# Patient Record
Sex: Female | Born: 1980 | Race: White | Hispanic: No | Marital: Married | Smoking: Never smoker
Health system: Southern US, Community
[De-identification: ages and names within clinical notes are randomized; demographics above are authoritative.]

## PROBLEM LIST (undated history)

## (undated) DIAGNOSIS — E042 Nontoxic multinodular goiter: Secondary | ICD-10-CM

## (undated) DIAGNOSIS — R87629 Unspecified abnormal cytological findings in specimens from vagina: Secondary | ICD-10-CM

## (undated) HISTORY — PX: WISDOM TOOTH EXTRACTION: SHX21

## (undated) HISTORY — DX: Nontoxic multinodular goiter: E04.2

---

## 2007-09-30 ENCOUNTER — Ambulatory Visit: Payer: Self-pay | Admitting: Obstetrics & Gynecology

## 2007-10-07 ENCOUNTER — Ambulatory Visit: Payer: Self-pay | Admitting: Obstetrics & Gynecology

## 2007-10-14 ENCOUNTER — Ambulatory Visit: Payer: Self-pay | Admitting: Obstetrics & Gynecology

## 2007-10-21 ENCOUNTER — Ambulatory Visit: Payer: Self-pay | Admitting: Obstetrics & Gynecology

## 2007-10-27 ENCOUNTER — Ambulatory Visit: Payer: Self-pay | Admitting: *Deleted

## 2007-10-27 ENCOUNTER — Inpatient Hospital Stay (HOSPITAL_COMMUNITY): Admission: AD | Admit: 2007-10-27 | Discharge: 2007-10-29 | Payer: Self-pay | Admitting: Obstetrics & Gynecology

## 2010-09-18 NOTE — Op Note (Signed)
NAME:  Monda, Malone                    ACCOUNT NO.:  [id number]   MEDICAL RECORD NO.:  [id number]          PATIENT TYPE:  INP   LOCATION:  9107                          FACILITY:  [facility]   PHYSICIAN:  Ginger Carne, MD  DATE OF BIRTH:  1981/04/16   DATE OF PROCEDURE:  DATE OF DISCHARGE:                               OPERATIVE REPORT   PREOPERATIVE DIAGNOSES:  1. Intrauterine pregnancy at 40 weeks 1 day gestation.  2. Prolonged second stage of labor.  3. Chorioamnionitis.  4. Maternal exhaustion.   POSTOPERATIVE DIAGNOSES:  1. Intrauterine pregnancy at 40 weeks 1 day gestation.  2. Prolonged second stage of labor.  3. Chorioamnionitis.  4. Maternal exhaustion.   PROCEDURE:  Vacuum-assisted vaginal delivery.   SURGEON:  Karlton Lemon, MD.   ASSISTANT:  Blima Rich, MD.   FINDINGS:  Viable infant female weighing 8 pounds 3 ounces with Apgars 8  at one minute and 9 at five minutes.   ESTIMATED BLOOD LOSS:  400 mL.   COMPLICATIONS:  Second-degree perineal laceration.   INDICATIONS FOR PROCEDURE:  Ms. Lopata is a 30 year old gravida 1, para 0  with presentation to labor and delivery after spontaneous rupture of  membranes at 1 a.m. on the morning of October 27, 2007.  She was in early  labor, but did require augmentation of her labor with Pitocin.  She  progressed to complete at around 4:30 p.m. on October 27, 2007.  She was +1  station that time.  After 2 hours of pushing, she progressed to +2  station and was starting to get tired.  Due to prolonged second stage of  labor and maternal exhaustion as well as chorioamnionitis vacuum-  assisted vaginal delivery, the low pelvic outlet was indicated.   DESCRIPTION OF PROCEDURE:  The patient was inspected digitally and the  infant was found to be in the right occipitoposterior position.  Vacuum  was placed being careful not to place over the fontanelles.  With  contractions, the vacuum was pumped up to the green zone on the pressure  gauge meter on the vacuum.  As the patient pushed, traction was applied  to the vacuum beginning in a downward pressure.  On her second push,  there was a one pop off.  There were two more pop offs prior to delivery  of the baby.  After applying the vacuum for the fourth time, the patient  pushed and vacuum traction was again applied with the pressure gauge in  the green area.  The vacuum was pulled to provide good traction with a  normal choreic movements of the infant.  With a fourth pull, the  infant's head was delivered.  The anterior shoulder was then delivered  followed by body.  The infant was bulb suctioned after delivery.  Cord  was doubly clamped and incised.  The infant was taken to the warmer.  The infant did cry spontaneously after delivery.  Attention was then  turned to the placenta and cord blood was to be collected by the cord  bank.  Placenta delivered spontaneously intact  with three-vessel cord at  1946.  A second-degree perineal laceration was noted and was repaired  with 2-0 Vicryl in a normal fashion.  Estimated blood loss was noted to  be 400 mL.  The laceration was inspected once more and found to have  good hemostasis.  The patient and infant tolerated the procedure well  and will go to mother and baby in stable condition.      Karlton Lemon, MD  Electronically Signed     ______________________________  Ginger Carne, MD    NS/MEDQ  D:  10/27/2007  T:  10/28/2007  Job:  [id number]

## 2011-01-30 LAB — POCT URINALYSIS DIP (DEVICE)
Bilirubin Urine: NEGATIVE
Glucose, UA: NEGATIVE
Nitrite: NEGATIVE
Operator id: 297281
Specific Gravity, Urine: 1.015
Urobilinogen, UA: 0.2

## 2011-01-31 LAB — POCT URINALYSIS DIP (DEVICE)
Bilirubin Urine: NEGATIVE
Glucose, UA: NEGATIVE
Glucose, UA: NEGATIVE
Glucose, UA: NEGATIVE
Nitrite: NEGATIVE
Nitrite: NEGATIVE
Nitrite: NEGATIVE
Operator id: 297281
Operator id: 297281
Operator id: 297281
Specific Gravity, Urine: 1.02
Urobilinogen, UA: 0.2
Urobilinogen, UA: 0.2
Urobilinogen, UA: 0.2

## 2011-01-31 LAB — CBC
HCT: 32.1 — ABNORMAL LOW
Hemoglobin: 10.9 — ABNORMAL LOW
MCHC: 34.3
Platelets: 177
RBC: 4.36
WBC: 11.2 — ABNORMAL HIGH
WBC: 18.7 — ABNORMAL HIGH

## 2011-01-31 LAB — RPR: RPR Ser Ql: NONREACTIVE

## 2011-01-31 LAB — RH IMMUNE GLOB WKUP(>/=20WKS)(NOT WOMEN'S HOSP)

## 2013-01-01 ENCOUNTER — Other Ambulatory Visit: Payer: Self-pay | Admitting: Nurse Practitioner

## 2013-01-01 DIAGNOSIS — Z3201 Encounter for pregnancy test, result positive: Secondary | ICD-10-CM

## 2013-02-09 LAB — OB RESULTS CONSOLE ANTIBODY SCREEN: Antibody Screen: NEGATIVE

## 2013-02-09 LAB — OB RESULTS CONSOLE ABO/RH: RH TYPE: NEGATIVE

## 2013-02-09 LAB — OB RESULTS CONSOLE HEPATITIS B SURFACE ANTIGEN: HEP B S AG: NEGATIVE

## 2013-02-09 LAB — OB RESULTS CONSOLE HIV ANTIBODY (ROUTINE TESTING): HIV: NONREACTIVE

## 2013-02-09 LAB — OB RESULTS CONSOLE RUBELLA ANTIBODY, IGM: RUBELLA: NON-IMMUNE/NOT IMMUNE

## 2013-02-16 LAB — OB RESULTS CONSOLE GC/CHLAMYDIA
Chlamydia: NEGATIVE
Gonorrhea: NEGATIVE

## 2013-02-26 ENCOUNTER — Other Ambulatory Visit: Payer: Self-pay | Admitting: Nurse Practitioner

## 2013-05-06 NOTE — L&D Delivery Note (Signed)
Delivery Note At 7:50 AM a viable female was delivered via Vaginal, Spontaneous Delivery (Presentation: Right Occiput Anterior).  APGAR: 9, 9; weight .   Placenta status: Intact, Spontaneous.  Cord: 3 vessels with the following complications: None.  Cord pH: not indicated  Anesthesia: Epidural  Episiotomy: None Lacerations: 2nd degree;Perineal Suture Repair: 3.0 vicryl rapide 3 interrupted sutures placed, good tissue approximation Est. Blood Loss (mL):   Mom to postpartum.  Baby to Couplet care / Skin to Skin.  Jack Mineau A. Dashanique Brownstein 08/23/2013, 8:06 AM

## 2013-06-11 LAB — OB RESULTS CONSOLE RPR: RPR: NONREACTIVE

## 2013-08-02 LAB — OB RESULTS CONSOLE GBS: GBS: NEGATIVE

## 2013-08-23 ENCOUNTER — Inpatient Hospital Stay (HOSPITAL_COMMUNITY)
Admission: AD | Admit: 2013-08-23 | Discharge: 2013-08-25 | DRG: 775 | Disposition: A | Discharge status: Home / Self Care | Payer: 59 | Source: Ambulatory Visit | Attending: Obstetrics | Admitting: Obstetrics

## 2013-08-23 ENCOUNTER — Inpatient Hospital Stay (HOSPITAL_COMMUNITY): Payer: 59 | Admitting: Anesthesiology

## 2013-08-23 ENCOUNTER — Encounter (HOSPITAL_COMMUNITY): Payer: 59 | Admitting: Anesthesiology

## 2013-08-23 ENCOUNTER — Encounter (HOSPITAL_COMMUNITY): Payer: Self-pay | Admitting: *Deleted

## 2013-08-23 DIAGNOSIS — O99214 Obesity complicating childbirth: Secondary | ICD-10-CM

## 2013-08-23 DIAGNOSIS — E669 Obesity, unspecified: Secondary | ICD-10-CM | POA: Diagnosis present

## 2013-08-23 DIAGNOSIS — IMO0001 Reserved for inherently not codable concepts without codable children: Secondary | ICD-10-CM

## 2013-08-23 DIAGNOSIS — Z6839 Body mass index (BMI) 39.0-39.9, adult: Secondary | ICD-10-CM

## 2013-08-23 DIAGNOSIS — O360990 Maternal care for other rhesus isoimmunization, unspecified trimester, not applicable or unspecified: Secondary | ICD-10-CM | POA: Diagnosis present

## 2013-08-23 HISTORY — DX: Unspecified abnormal cytological findings in specimens from vagina: R87.629

## 2013-08-23 LAB — CBC
HEMATOCRIT: 37.8 % (ref 36.0–46.0)
Hemoglobin: 12.8 g/dL (ref 12.0–15.0)
MCH: 28.4 pg (ref 26.0–34.0)
MCHC: 33.9 g/dL (ref 30.0–36.0)
MCV: 83.8 fL (ref 78.0–100.0)
Platelets: 228 10*3/uL (ref 150–400)
RBC: 4.51 MIL/uL (ref 3.87–5.11)
RDW: 14 % (ref 11.5–15.5)
WBC: 14 10*3/uL — ABNORMAL HIGH (ref 4.0–10.5)

## 2013-08-23 LAB — RPR

## 2013-08-23 MED ORDER — IBUPROFEN 600 MG PO TABS
600.0000 mg | ORAL_TABLET | Freq: Four times a day (QID) | ORAL | Status: DC
  Administered 2013-08-23 – 2013-08-25 (×9): 600 mg via ORAL
  Filled 2013-08-23 (×9): qty 1

## 2013-08-23 MED ORDER — DIBUCAINE 1 % RE OINT
1.0000 | TOPICAL_OINTMENT | RECTAL | Status: DC | PRN
Start: 2013-08-23 — End: 2013-08-25

## 2013-08-23 MED ORDER — ONDANSETRON HCL 4 MG/2ML IJ SOLN: 4.0000 mg | INTRAMUSCULAR | Status: DC | PRN

## 2013-08-23 MED ORDER — DIPHENHYDRAMINE HCL 50 MG/ML IJ SOLN: 12.5000 mg | INTRAMUSCULAR | Status: DC | PRN

## 2013-08-23 MED ORDER — LACTATED RINGERS IV SOLN: 500.0000 mL | Freq: Once | INTRAVENOUS | Status: DC

## 2013-08-23 MED ORDER — SENNOSIDES-DOCUSATE SODIUM 8.6-50 MG PO TABS
2.0000 | ORAL_TABLET | ORAL | Status: DC
  Administered 2013-08-23 – 2013-08-25 (×2): 2 via ORAL
  Filled 2013-08-23 (×2): qty 2

## 2013-08-23 MED ORDER — FLEET ENEMA 7-19 GM/118ML RE ENEM: 1.0000 | ENEMA | RECTAL | Status: DC | PRN

## 2013-08-23 MED ORDER — BENZOCAINE-MENTHOL 20-0.5 % EX AERO
1.0000 "application " | INHALATION_SPRAY | CUTANEOUS | Status: DC | PRN
  Administered 2013-08-23: 1 via TOPICAL
  Filled 2013-08-23: qty 56

## 2013-08-23 MED ORDER — PHENYLEPHRINE 40 MCG/ML (10ML) SYRINGE FOR IV PUSH (FOR BLOOD PRESSURE SUPPORT)
80.0000 ug | PREFILLED_SYRINGE | INTRAVENOUS | Status: DC | PRN
  Filled 2013-08-23: qty 10
  Filled 2013-08-23: qty 2

## 2013-08-23 MED ORDER — FLEET ENEMA 7-19 GM/118ML RE ENEM: 1.0000 | ENEMA | Freq: Every day | RECTAL | Status: DC | PRN

## 2013-08-23 MED ORDER — ONDANSETRON HCL 4 MG PO TABS: 4.0000 mg | ORAL_TABLET | ORAL | Status: DC | PRN

## 2013-08-23 MED ORDER — LIDOCAINE HCL (PF) 1 % IJ SOLN
30.0000 mL | INTRAMUSCULAR | Status: DC | PRN
  Filled 2013-08-23: qty 30

## 2013-08-23 MED ORDER — EPHEDRINE 5 MG/ML INJ
10.0000 mg | INTRAVENOUS | Status: DC | PRN
  Filled 2013-08-23: qty 2

## 2013-08-23 MED ORDER — EPHEDRINE 5 MG/ML INJ
10.0000 mg | INTRAVENOUS | Status: DC | PRN
  Filled 2013-08-23: qty 4
  Filled 2013-08-23: qty 2

## 2013-08-23 MED ORDER — LIDOCAINE-EPINEPHRINE (PF) 2 %-1:200000 IJ SOLN
INTRAMUSCULAR | Status: DC | PRN
  Administered 2013-08-23: 5 mL via EPIDURAL

## 2013-08-23 MED ORDER — ZOLPIDEM TARTRATE 5 MG PO TABS: 5.0000 mg | ORAL_TABLET | Freq: Every evening | ORAL | Status: DC | PRN

## 2013-08-23 MED ORDER — ACETAMINOPHEN 325 MG PO TABS: 650.0000 mg | ORAL_TABLET | ORAL | Status: DC | PRN

## 2013-08-23 MED ORDER — TETANUS-DIPHTH-ACELL PERTUSSIS 5-2.5-18.5 LF-MCG/0.5 IM SUSP: 0.5000 mL | Freq: Once | INTRAMUSCULAR | Status: DC

## 2013-08-23 MED ORDER — PRENATAL MULTIVITAMIN CH
1.0000 | ORAL_TABLET | Freq: Every day | ORAL | Status: DC
  Administered 2013-08-23 – 2013-08-25 (×3): 1 via ORAL
  Filled 2013-08-23 (×3): qty 1

## 2013-08-23 MED ORDER — LACTATED RINGERS IV SOLN
INTRAVENOUS | Status: DC
  Administered 2013-08-23 (×2): via INTRAVENOUS

## 2013-08-23 MED ORDER — OXYTOCIN BOLUS FROM INFUSION
500.0000 mL | INTRAVENOUS | Status: DC
  Administered 2013-08-23: 500 mL via INTRAVENOUS

## 2013-08-23 MED ORDER — PHENYLEPHRINE 40 MCG/ML (10ML) SYRINGE FOR IV PUSH (FOR BLOOD PRESSURE SUPPORT)
80.0000 ug | PREFILLED_SYRINGE | INTRAVENOUS | Status: DC | PRN
  Filled 2013-08-23: qty 2

## 2013-08-23 MED ORDER — CITRIC ACID-SODIUM CITRATE 334-500 MG/5ML PO SOLN: 30.0000 mL | ORAL | Status: DC | PRN

## 2013-08-23 MED ORDER — DIPHENHYDRAMINE HCL 25 MG PO CAPS: 25.0000 mg | ORAL_CAPSULE | Freq: Four times a day (QID) | ORAL | Status: DC | PRN

## 2013-08-23 MED ORDER — OXYTOCIN 40 UNITS IN LACTATED RINGERS INFUSION - SIMPLE MED
62.5000 mL/h | INTRAVENOUS | Status: DC
  Filled 2013-08-23: qty 1000

## 2013-08-23 MED ORDER — BISACODYL 10 MG RE SUPP: 10.0000 mg | Freq: Every day | RECTAL | Status: DC | PRN

## 2013-08-23 MED ORDER — IBUPROFEN 600 MG PO TABS: 600.0000 mg | ORAL_TABLET | Freq: Four times a day (QID) | ORAL | Status: DC | PRN

## 2013-08-23 MED ORDER — LANOLIN HYDROUS EX OINT: TOPICAL_OINTMENT | CUTANEOUS | Status: DC | PRN

## 2013-08-23 MED ORDER — OXYCODONE-ACETAMINOPHEN 5-325 MG PO TABS: 1.0000 | ORAL_TABLET | ORAL | Status: DC | PRN

## 2013-08-23 MED ORDER — ONDANSETRON HCL 4 MG/2ML IJ SOLN: 4.0000 mg | Freq: Four times a day (QID) | INTRAMUSCULAR | Status: DC | PRN

## 2013-08-23 MED ORDER — LACTATED RINGERS IV SOLN: 500.0000 mL | INTRAVENOUS | Status: DC | PRN

## 2013-08-23 MED ORDER — SIMETHICONE 80 MG PO CHEW: 80.0000 mg | CHEWABLE_TABLET | ORAL | Status: DC | PRN

## 2013-08-23 MED ORDER — WITCH HAZEL-GLYCERIN EX PADS: 1.0000 "application " | MEDICATED_PAD | CUTANEOUS | Status: DC | PRN

## 2013-08-23 MED ORDER — FENTANYL 2.5 MCG/ML BUPIVACAINE 1/10 % EPIDURAL INFUSION (WH - ANES)
14.0000 mL/h | INTRAMUSCULAR | Status: DC | PRN
  Administered 2013-08-23: 14 mL/h via EPIDURAL
  Filled 2013-08-23: qty 125

## 2013-08-23 NOTE — Anesthesia Procedure Notes (Signed)

## 2013-08-23 NOTE — H&P (Signed)
Leslie Morton is a 33 y.o. G2P1001 at [redacted]w[redacted]d presenting for labor. Pt notes contractions getting stronger . Good fetal movement, No vaginal bleeding, not leaking fluid  [facility] 1st trimester - marginal cord insertion. AGA u/s - obesity, 27# wt gain in preg, no GDM - O neg, s/p Rhogam - h/o VAVD for chorio   Prenatal Transfer Tool  Maternal Diabetes: No Genetic Screening: Normal Maternal Ultrasounds/Referrals: Normal Fetal Ultrasounds or other Referrals:  None Maternal Substance Abuse:  No Significant Maternal Medications:  None Significant Maternal Lab Results: None     OB History   Grav Para Term Preterm Abortions TAB SAB Ect Mult Living   2 1 1       1      Past Medical History  Diagnosis Date  . Vaginal Pap smear, abnormal    Past Surgical History  Procedure Laterality Date  . Wisdom tooth extraction     Family History: family history is not on file. Social History:  reports that she has never smoked. She does not have any smokeless tobacco history on file. She reports that she does not drink alcohol or use illicit drugs.  Review of Systems - Negative except painful contractions   Dilation: 9 Effacement (%): 80 Station: -2 Exam by:: dr. Ernestina Pennafogleman Blood pressure 139/83, pulse 95, temperature 97.3 F (36.3 C), temperature source Oral, resp. rate 18, height 5\' 2"  (1.575 m), weight 97.523 kg (215 lb), SpO2 99.00%.  Physical Exam:  Filed Vitals:   08/23/13 [id number] 08/23/13 0648 08/23/13 0650 08/23/13 0651  BP: 164/83 164/83  139/83  Pulse: 91 91 100 95  Temp:      TempSrc:      Resp:  18  18  Height:      Weight:      SpO2: 100% 100%  99%    Gen: well appearing, no distress  Abd: gravid, NT, no RUQ pain LE: 1+ edema, equal bilaterally, non-tender, 2+ DTR, no clonus Toco: q413min FH: baseline 135s, accelerations present, no deceleratons, 10 beat variability cvx 4.5 cm on admit, rapid progress. AROM for thin meconium at 9 cm  Prenatal  labs: ABO, Rh: O/Negative/-- (10/07 0000) Antibody: Negative (10/07 0000) Rubella:  immune RPR: Nonreactive (02/06 0000)  HBsAg: Negative (10/07 0000)  HIV: Non-reactive (10/07 0000)  GBS: Negative (03/30 0000)  1 hr Glucola 147. Nl 3 hr  Genetic screening nl quad Anatomy US normal   Assessment/Plan: 33 y.o. G2P1001 at 764w4d Active labor, anticipate NSVD, s/p AROM Meconium Epidural Reactive fetal testing   Tresa EndoKelly A. Jayde Daffin 08/23/2013, 6:58 AM

## 2013-08-23 NOTE — Anesthesia Preprocedure Evaluation (Addendum)

## 2013-08-23 NOTE — MAU Note (Signed)
Contractions every 3-5 for the last 1 hour. Denies vaginal bleeding and leaking of fluid. +FM

## 2013-08-23 NOTE — Lactation Note (Signed)
This note was copied from the chart of Leslie Seidy Abila. Lactation Consultation Note Initial consult:  Baby latched in cradle position on right breast.  Reviewed hand expression on left. Rhythmical sucks observed. Mom encouraged to feed baby 8-12 times/24 hours and with feeding cues.  Encouraged mother to try fb hold for more depth. Reviewed basics, cluster feeding, Baby & Me pp 20-24. Mom made aware of O/P services, breastfeeding support groups, community resources, and our phone # for post-discharge questions.   Patient Name: Leslie Morton Today's Date: 08/23/2013 Reason for consult: Initial assessment   Maternal Data Infant to breast within first hour of birth: Yes Has patient been taught Hand Expression?: Yes Does the patient have breastfeeding experience prior to this delivery?: Yes  Feeding Feeding Type: Breast Fed  LATCH Score/Interventions Latch: Grasps breast easily, tongue down, lips flanged, rhythmical sucking. Intervention(s): Adjust position;Assist with latch  Audible Swallowing: A few with stimulation  Type of Nipple: Everted at rest and after stimulation  Comfort (Breast/Nipple): Soft / non-tender     Hold (Positioning): Assistance needed to correctly position infant at breast and maintain latch.  LATCH Score: 8  Lactation Tools Discussed/Used     Consult Status Consult Status: Follow-up Date: 08/24/13 Follow-up type: In-patient    Dulce SellarRuth Boschen Berkelhammer 08/23/2013, 3:04 PM

## 2013-08-24 MED ORDER — MEASLES, MUMPS & RUBELLA VAC ~~LOC~~ INJ
0.5000 mL | INJECTION | Freq: Once | SUBCUTANEOUS | Status: AC
  Administered 2013-08-25: 0.5 mL via SUBCUTANEOUS
  Filled 2013-08-24: qty 0.5

## 2013-08-24 MED ORDER — RHO D IMMUNE GLOBULIN 1500 UNIT/2ML IJ SOLN
300.0000 ug | Freq: Once | INTRAMUSCULAR | Status: AC
  Administered 2013-08-24: 300 ug via INTRAMUSCULAR
  Filled 2013-08-24: qty 2

## 2013-08-24 NOTE — Lactation Note (Signed)
This note was copied from the chart of Girl Jeny Mangione. Lactation Consultation Note Follow up at 37 hours of age.  Baby has had one void in lifetime and is on double photo therapy.  Mom reports feeding baby 1 hours ago for 15 minutes.  Baby asleep in crib and mom trying to rest.  Mom reports having collection containers for hand expression, but she has not done this yet.  Encouraged mom to call for assist as needed.  [facility].   Patient Name: Girl Staphanie Gerety Today's Date: 08/24/2013     Maternal Data    Feeding Feeding Type: Breast Fed Length of feed: 15 min  LATCH Score/Interventions                      Lactation Tools Discussed/Used     Consult Status      Arvella MerlesJana Lynn Brynn Reznik 08/24/2013, 9:06 PM

## 2013-08-24 NOTE — Progress Notes (Signed)
PPD 1 SVD  S:  Reports feeling ok             Tolerating po/ No nausea or vomiting             Bleeding is light             Pain controlled with motrin and percocet             Up ad lib / ambulatory / voiding QS  Newborn breast feeding  / baby under bili-lights  O:               VS: BP 112/58  Pulse 79  Temp(Src) 98.6 F (37 C) (Oral)  Resp 20  Ht 5\' 2"  (1.575 m)  Wt 97.523 kg (215 lb)  BMI 39.31 kg/m2  SpO2 99%  Breastfeeding? Unknown   LABS:              Recent Labs  08/23/13 0535  WBC 14.0*  HGB 12.8  PLT 228               Blood type: --/--/O NEG (04/20 0535)  Rubella: Nonimmune (10/07 0000)                     I&O: Intake/Output     04/20 0701 - 04/21 0700 04/21 0701 - 04/22 0700   Urine (mL/kg/hr) 400 (0.2)    Blood 350 (0.1)    Total Output 750     Net -750          Urine Occurrence 1 x                  Physical Exam:             Alert and oriented X3  Lungs: Clear and unlabored  Heart: regular rate and rhythm / no mumurs  Abdomen: soft, non-tender, non-distended              Fundus: firm, non-tender, U-1  Perineum: no edema  Lochia: light  Extremities: 1+ pedal edema, no calf pain or tenderness    A: PPD # 1   Doing well - stable status  P: Routine post partum orders  Possible rooming-in tomorrow pending newborn status for discharge  Marlinda Mikeanya Adelie Croswell CNM, MSN, [facility] 08/24/2013, 8:47 AM

## 2013-08-24 NOTE — Lactation Note (Signed)
This note was copied from the chart of Leslie Morton. Lactation Consultation Note  Patient Name: Leslie Morton Today's Date: 08/24/2013 Reason for consult: Follow-up assessment;Hyperbilirubinemia LC came in at the end of the feeding. Assisted Mom with positioning to help obtain more depth with latch. Mom trying to nurse laid back with bili blankets. LC did not hear swallows at this visit, but Mom reports that she has heard some swallows with baby at the breast. Baby is nursing frequently. Colostrum present with hand expression. Reviewed importance of positioning and deep latch to milk transfer. Discussed with Mom using hand expression to increase milk production and giving baby back any amount of EBM she receives via spoon or curved tipped syringe. Also discussed post pumping, Mom will advise if she decides to pump, but is interested in hand expression. Advised to monitor voids/stools. Advised to ask for assist as needed.   Maternal Data    Feeding Feeding Type: Breast Fed Length of feed: 15 min  LATCH Score/Interventions Latch: Grasps breast easily, tongue down, lips flanged, rhythmical sucking. Intervention(s): Adjust position;Assist with latch;Breast massage;Breast compression  Audible Swallowing: None  Type of Nipple: Everted at rest and after stimulation  Comfort (Breast/Nipple): Soft / non-tender     Hold (Positioning): Assistance needed to correctly position infant at breast and maintain latch. Intervention(s): Breastfeeding basics reviewed;Support Pillows;Position options;Skin to skin  LATCH Score: 7  Lactation Tools Discussed/Used     Consult Status Consult Status: Follow-up Date: 08/25/13 Follow-up type: In-patient    Kearney HardKathy Ann Katiana Ruland 08/24/2013, 1:16 PM

## 2013-08-25 LAB — RH IG WORKUP (INCLUDES ABO/RH)
ABO/RH(D): O NEG
Fetal Screen: NEGATIVE
Gestational Age(Wks): 38.4
Unit division: 0

## 2013-08-25 MED ORDER — IBUPROFEN 600 MG PO TABS: 600.0000 mg | ORAL_TABLET | Freq: Four times a day (QID) | ORAL | Status: DC

## 2013-08-25 MED ORDER — MEASLES, MUMPS & RUBELLA VAC ~~LOC~~ INJ: 0.5000 mL | INJECTION | Freq: Once | SUBCUTANEOUS | Status: DC

## 2013-08-25 NOTE — Progress Notes (Signed)
PPD #2- SVD  Subjective:   Reports feeling well Tolerating po/ No nausea or vomiting Bleeding is light Pain controlled with Motrin Up ad lib / ambulatory / voiding without problems Newborn: breastfeeding     Objective:   VS: VS:  Filed Vitals:   08/23/13 2127 08/24/13 0613 08/24/13 1800 08/25/13 0619  BP: 123/72 112/58 120/77 115/75  Pulse: 84 79 78 84  Temp: 98.3 F (36.8 C) 98.6 F (37 C) 98.2 F (36.8 C) 97.7 F (36.5 C)  TempSrc: Oral Oral Oral Oral  Resp: $Remo'20 20 18 18  'KdYky$ Height:      Weight:      SpO2:        LABS:  Recent Labs  08/23/13 0535  WBC 14.0*  HGB 12.8  PLT 228   Blood type: --/--/O NEG (04/21 0600) Rubella: Nonimmune (10/07 0000)                I&O: Intake/Output     04/21 0701 - 04/22 0700 04/22 0701 - 04/23 0700   Urine (mL/kg/hr)     Blood     Total Output       Net              Physical Exam: Alert and oriented X3 Abdomen: soft, non-tender, non-distended  Fundus: firm, non-tender, U-1 Perineum: Well approximated, no significant erythema, edema, or drainage; healing well. Lochia: small Extremities: 1+ BLE edema, no calf pain or tenderness    Assessment: PPD # 2 G2P2002/ S/P:spontaneous vaginal, 2nd degree laceration Rh Negative Rubella non-immune Doing well - stable for discharge home   Plan: Discharge home Rhogam given MMR prior to discharge RX's:  Ibuprofen $RemoveBe'600mg'HQPjumVYK$  po Q 6 hrs prn pain #30 Refill x 0 Routine pp visit in [facility] booklet given    Graciela Husbands MSN, CNM 08/25/2013, 10:01 AM

## 2013-08-25 NOTE — Discharge Summary (Signed)
Obstetric Discharge Summary Reason for Admission: onset of labor Prenatal Procedures: NST, ultrasound and marginal CI, Rh negative Intrapartum Procedures: spontaneous vaginal delivery Postpartum Procedures: Rho(D) Ig Complications-Operative and Postpartum: 2nd degree degree perineal laceration Hemoglobin  Date Value Ref Range Status  08/23/2013 12.8  12.0 - 15.0 g/dL Final     HCT  Date Value Ref Range Status  08/23/2013 37.8  36.0 - 46.0 % Final    Physical Exam:  General: alert and cooperative Lochia: appropriate Uterine Fundus: firm Incision: healing well, no significant drainage, no dehiscence, no significant erythema DVT Evaluation: No evidence of DVT seen on physical exam. Negative Homan's sign. No cords or calf tenderness. No significant calf/ankle edema.  Discharge Diagnoses: Term Pregnancy-delivered  Discharge Information: Date: 08/25/2013 Activity: pelvic rest Diet: routine Medications: PNV and Ibuprofen Condition: stable Instructions: refer to practice specific booklet Discharge to: home Follow-up Information   Follow up with Lenoard AdenAAVON,RICHARD J, MD. Schedule an appointment as soon as possible for a visit in 6 weeks.   Specialty:  Obstetrics and Gynecology   Contact information:   [address] Severe[address] [place] [id number] [phone]       Newborn Data: Live born female on 06/25/13 Birth Weight: 7 lb 9.9 oz (3456 g) APGAR: 9, 9  Home with mother.  Lawernce PittsMelanie N Schylar Allard 08/25/2013, 12:07 PM

## 2013-08-25 NOTE — Lactation Note (Signed)
This note was copied from the chart of Leslie Morton. Lactation Consultation Note States suppliments X1 formula in bottle. Had a good feeding after that. On DPT. Encouraged to BF w/cues and attempt if hasn't cued in 3 hrs. Mom encouraged to feed baby 8-12 times/24 hours and with feeding cues. Encouraged to call for assistance if needed and to verify proper latch. Patient Name: Leslie Morton Today's Date: 08/25/2013     Maternal Data    Feeding    LATCH Score/Interventions                      Lactation Tools Discussed/Used     Consult Status      Charyl DancerLaura G Tynesha Free 08/25/2013, 6:22 AM

## 2013-08-26 LAB — TYPE AND SCREEN
ABO/RH(D): O NEG
Antibody Screen: POSITIVE
DAT, IGG: NEGATIVE
UNIT DIVISION: 0
Unit division: 0

## 2014-03-07 ENCOUNTER — Encounter (HOSPITAL_COMMUNITY): Payer: Self-pay | Admitting: *Deleted

## 2014-03-10 ENCOUNTER — Ambulatory Visit (INDEPENDENT_AMBULATORY_CARE_PROVIDER_SITE_OTHER): Payer: BC Managed Care – PPO | Admitting: Nurse Practitioner

## 2014-03-10 VITALS — BP 124/83 | HR 87 | Temp 98.9°F | Ht 62.0 in | Wt 194.0 lb

## 2014-03-10 DIAGNOSIS — J029 Acute pharyngitis, unspecified: Secondary | ICD-10-CM

## 2014-03-10 MED ORDER — AMOXICILLIN 875 MG PO TABS: 875.0000 mg | ORAL_TABLET | Freq: Two times a day (BID) | ORAL | Status: DC

## 2014-03-10 NOTE — Patient Instructions (Signed)

## 2014-03-10 NOTE — Progress Notes (Signed)
   Subjective:    Patient ID: Leslie Morton, female    DOB: 11/11/1980, 33 y.o.   MRN: [id number]  HPI Patient in today c/o sore throat- started hurting 3 days ago- no fever. Her daughter has been sick for over a week.    Review of Systems  Constitutional: Negative for fever and chills.  HENT: Positive for congestion, sore throat, trouble swallowing and voice change.   Respiratory: Negative.   Cardiovascular: Negative.   Gastrointestinal: Negative.   Genitourinary: Negative.   Neurological: Negative.   Psychiatric/Behavioral: Negative.   All other systems reviewed and are negative.      Objective:   Physical Exam  Constitutional: She is oriented to person, place, and time. She appears well-developed and well-nourished. No distress.  HENT:  Right Ear: Hearing, tympanic membrane, external ear and ear canal normal.  Left Ear: Hearing, tympanic membrane, external ear and ear canal normal.  Nose: Mucosal edema and rhinorrhea present. Right sinus exhibits no maxillary sinus tenderness and no frontal sinus tenderness. Left sinus exhibits no maxillary sinus tenderness and no frontal sinus tenderness.  Mouth/Throat: Uvula is midline and mucous membranes are normal. Posterior oropharyngeal edema and posterior oropharyngeal erythema present.  Eyes: Pupils are equal, round, and reactive to light.  Neck: Normal range of motion.  Cardiovascular: Normal rate, regular rhythm and normal heart sounds.   Pulmonary/Chest: Effort normal and breath sounds normal.  Lymphadenopathy:    She has cervical adenopathy (right tonsillar).  Neurological: She is alert and oriented to person, place, and time.  Skin: Skin is warm.  Psychiatric: She has a normal mood and affect. Her behavior is normal. Judgment and thought content normal.   BP 124/83 mmHg  Pulse 87  Temp(Src) 98.9 F (37.2 C) (Oral)  Ht 5\' 2"  (1.575 m)  Wt 194 lb (87.998 kg)  BMI 35.47 kg/m2         Assessment & Plan:   1. Acute  pharyngitis, unspecified pharyngitis type    Meds ordered this encounter  Medications  . LORYNA 3-0.02 MG tablet    Sig:     Refill:  6  . dextromethorphan 15 MG/5ML syrup    Sig: Take 10 mLs by mouth 4 (four) times daily as needed for cough.  Marland Kitchen. amoxicillin (AMOXIL) 875 MG tablet    Sig: Take 1 tablet (875 mg total) by mouth 2 (two) times daily.    Dispense:  20 tablet    Refill:  0    Order Specific Question:  Supervising Provider    Answer:  Deborra MedinaMOORE, DONALD W [[id number]]   Force fluids Motrin or tylenol OTC OTC decongestant Throat lozenges if help New toothbrush in 3 days  Mary-Margaret Daphine DeutscherMartin, FNP

## 2014-07-19 ENCOUNTER — Encounter: Payer: Self-pay | Admitting: Nurse Practitioner

## 2014-07-19 ENCOUNTER — Ambulatory Visit (INDEPENDENT_AMBULATORY_CARE_PROVIDER_SITE_OTHER): Payer: BC Managed Care – PPO | Admitting: Nurse Practitioner

## 2014-07-19 VITALS — BP 130/94 | HR 78 | Temp 97.6°F | Ht 62.0 in | Wt 193.8 lb

## 2014-07-19 DIAGNOSIS — H109 Unspecified conjunctivitis: Secondary | ICD-10-CM

## 2014-07-19 MED ORDER — TOBRAMYCIN 0.3 % OP SOLN: 1.0000 [drp] | OPHTHALMIC | Status: DC

## 2014-07-19 NOTE — Patient Instructions (Signed)

## 2014-07-19 NOTE — Progress Notes (Signed)
  Subjective:    Leslie Morton is a 34 y.o. female who presents for evaluation of discharge, erythema, itching and photophobia in the left eye. She has noticed the above symptoms for 1 day. Onset was sudden. Patient denies visual field deficit. There is a history of allergies.  The following portions of the patient's history were reviewed and updated as appropriate: allergies, current medications, past family history, past medical history, past social history, past surgical history and problem list.  Review of Systems Pertinent items are noted in HPI.   Objective:     BP 130/94 mmHg  Pulse 78  Temp(Src) 97.6 F (36.4 C) (Oral)  Ht 5\' 2"  (1.575 m)  Wt 193 lb 12.8 oz (87.907 kg)  BMI 35.44 kg/m2       General: alert and cooperative  Eyes:  negative findings: lids and lashes normal, conjunctivae and sclerae normal and Right eye, positive findings: conjunctiva: 1+ injection left eye  Vision:   Fluorescein:  N/A     Assessment:    Acute conjunctivitis   Plan:      Meds ordered this encounter  Medications  . tobramycin (TOBREX) 0.3 % ophthalmic solution    Sig: Place 1-2 drops into the left eye every 4 (four) hours.    Dispense:  5 mL    Refill:  0    Order Specific Question:  Supervising Provider    Answer:  Ernestina PennaMOORE, DONALD W [[id number]]   Do not rub eye Cool compresses Good handwashing  Mary-Margaret Daphine DeutscherMartin, FNP

## 2014-09-19 ENCOUNTER — Encounter: Payer: Self-pay | Admitting: Nurse Practitioner

## 2014-09-19 ENCOUNTER — Ambulatory Visit (INDEPENDENT_AMBULATORY_CARE_PROVIDER_SITE_OTHER): Payer: BC Managed Care – PPO | Admitting: Nurse Practitioner

## 2014-09-19 VITALS — BP 137/91 | HR 77 | Temp 98.5°F | Ht 62.0 in | Wt 192.0 lb

## 2014-09-19 DIAGNOSIS — Z01419 Encounter for gynecological examination (general) (routine) without abnormal findings: Secondary | ICD-10-CM | POA: Diagnosis not present

## 2014-09-19 DIAGNOSIS — Z0000 Encounter for general adult medical examination without abnormal findings: Secondary | ICD-10-CM | POA: Diagnosis not present

## 2014-09-19 LAB — POCT UA - MICROSCOPIC ONLY
Casts, Ur, LPF, POC: NEGATIVE
Crystals, Ur, HPF, POC: NEGATIVE
MUCUS UA: NEGATIVE
RBC, urine, microscopic: NEGATIVE
YEAST UA: NEGATIVE

## 2014-09-19 LAB — POCT CBC
GRANULOCYTE PERCENT: 55.8 % (ref 37–80)
HEMATOCRIT: 43.8 % (ref 37.7–47.9)
Hemoglobin: 13.8 g/dL (ref 12.2–16.2)
LYMPH, POC: 2.9 (ref 0.6–3.4)
MCH, POC: 27.3 pg (ref 27–31.2)
MCHC: 31.5 g/dL — AB (ref 31.8–35.4)
MCV: 86.7 fL (ref 80–97)
MPV: 7.9 fL (ref 0–99.8)
PLATELET COUNT, POC: 281 10*3/uL (ref 142–424)
POC Granulocyte: 4.2 (ref 2–6.9)
POC LYMPH PERCENT: 38.4 %L (ref 10–50)
RBC: 5.05 M/uL (ref 4.04–5.48)
RDW, POC: 12.5 %
WBC: 7.5 10*3/uL (ref 4.6–10.2)

## 2014-09-19 LAB — POCT URINALYSIS DIPSTICK
BILIRUBIN UA: NEGATIVE
Blood, UA: NEGATIVE
GLUCOSE UA: NEGATIVE
Ketones, UA: NEGATIVE
NITRITE UA: NEGATIVE
Protein, UA: NEGATIVE
Spec Grav, UA: 1.01
UROBILINOGEN UA: NEGATIVE
pH, UA: 5

## 2014-09-19 MED ORDER — DROSPIRENONE-ETHINYL ESTRADIOL 3-0.02 MG PO TABS: 1.0000 | ORAL_TABLET | Freq: Every day | ORAL | Status: DC

## 2014-09-19 NOTE — Progress Notes (Signed)
   Subjective:    Patient ID: Leslie Morton, female    DOB: [birth year], 34 y.o.   MRN: [id number]  HPI Patient here today for annual physical exam and pap. She is doing well today without complaints. Only rx she is on is birth control.    Review of Systems  Constitutional: Negative.   HENT: Negative.   Respiratory: Negative.   Cardiovascular: Negative.   Gastrointestinal: Negative.   Genitourinary: Negative.   Neurological: Negative.   Psychiatric/Behavioral: Negative.   All other systems reviewed and are negative.      Objective:   Physical Exam  Constitutional: She is oriented to person, place, and time. She appears well-developed and well-nourished.  HENT:  Head: Normocephalic.  Right Ear: Hearing, tympanic membrane, external ear and ear canal normal.  Left Ear: Hearing, tympanic membrane, external ear and ear canal normal.  Nose: Nose normal.  Mouth/Throat: Uvula is midline and oropharynx is clear and moist.  Eyes: Conjunctivae and EOM are normal. Pupils are equal, round, and reactive to light.  Neck: Normal range of motion and full passive range of motion without pain. Neck supple. No JVD present. Carotid bruit is not present. No thyroid mass and no thyromegaly present.  Cardiovascular: Normal rate, normal heart sounds and intact distal pulses.   No murmur heard. Pulmonary/Chest: Effort normal and breath sounds normal. Right breast exhibits no inverted nipple, no mass, no nipple discharge, no skin change and no tenderness. Left breast exhibits no inverted nipple, no mass, no nipple discharge, no skin change and no tenderness.  Abdominal: Soft. Bowel sounds are normal. She exhibits no mass. There is no tenderness.  Genitourinary: Vagina normal and uterus normal. No breast swelling, tenderness, discharge or bleeding.  bimanual exam-No adnexal masses or tenderness. Cervix parous and pink- no vaginal discharge   Musculoskeletal: Normal range of motion.  Lymphadenopathy:    She  has no cervical adenopathy.  Neurological: She is alert and oriented to person, place, and time.  Skin: Skin is warm and dry.  Psychiatric: She has a normal mood and affect. Her behavior is normal. Judgment and thought content normal.   BP 137/91 mmHg  Pulse 77  Temp(Src) 98.5 F (36.9 C) (Oral)  Ht $R'5\' 2"'RA$  (1.575 m)  Wt 192 lb (87.091 kg)  BMI 35.11 kg/m2        Assessment & Plan:  1. Annual physical exam - POCT UA - Microscopic Only - POCT urinalysis dipstick - POCT CBC - CMP14+EGFR - NMR, lipoprofile - Thyroid Panel With TSH - Vit D  25 hydroxy (rtn osteoporosis monitoring) - Pap IG w/ reflex to HPV when ASC-U - drospirenone-ethinyl estradiol (LORYNA) 3-0.02 MG tablet; Take 1 tablet by mouth daily.  Dispense: 1 Package; Refill: 11 - STD Screening Panel/High Risk - HIV antibody (with reflex)  2. Encounter for routine gynecological examination - Pap IG w/ reflex to HPV when ASC-U    Labs pending Health maintenance reviewed Diet and exercise encouraged Continue all meds Follow up  In 1 year   Mary-Margaret Hassell Done, FNP

## 2014-09-19 NOTE — Patient Instructions (Signed)
Fat and Cholesterol Control Diet Fat and cholesterol levels in your blood and organs are influenced by your diet. High levels of fat and cholesterol may lead to diseases of the heart, small and large blood vessels, gallbladder, liver, and pancreas. CONTROLLING FAT AND CHOLESTEROL WITH DIET Although exercise and lifestyle factors are important, your diet is key. That is because certain foods are known to raise cholesterol and others to lower it. The goal is to balance foods for their effect on cholesterol and more importantly, to replace saturated and trans fat with other types of fat, such as monounsaturated fat, polyunsaturated fat, and omega-3 fatty acids. On average, a person should consume no more than 15 to 17 g of saturated fat daily. Saturated and trans fats are considered "bad" fats, and they will raise LDL cholesterol. Saturated fats are primarily found in animal products such as meats, butter, and cream. However, that does not mean you need to give up all your favorite foods. Today, there are good tasting, low-fat, low-cholesterol substitutes for most of the things you like to eat. Choose low-fat or nonfat alternatives. Choose round or loin cuts of red meat. These types of cuts are lowest in fat and cholesterol. Chicken (without the skin), fish, veal, and ground turkey breast are great choices. Eliminate fatty meats, such as hot dogs and salami. Even shellfish have little or no saturated fat. Have a 3 oz (85 g) portion when you eat lean meat, poultry, or fish. Trans fats are also called "partially hydrogenated oils." They are oils that have been scientifically manipulated so that they are solid at room temperature resulting in a longer shelf life and improved taste and texture of foods in which they are added. Trans fats are found in stick margarine, some tub margarines, cookies, crackers, and baked goods.  When baking and cooking, oils are a great substitute for butter. The monounsaturated oils are  especially beneficial since it is believed they lower LDL and raise HDL. The oils you should avoid entirely are saturated tropical oils, such as coconut and palm.  Remember to eat a lot from food groups that are naturally free of saturated and trans fat, including fish, fruit, vegetables, beans, grains (barley, rice, couscous, bulgur wheat), and pasta (without cream sauces).  IDENTIFYING FOODS THAT LOWER FAT AND CHOLESTEROL  Soluble fiber may lower your cholesterol. This type of fiber is found in fruits such as apples, vegetables such as broccoli, potatoes, and carrots, legumes such as beans, peas, and lentils, and grains such as barley. Foods fortified with plant sterols (phytosterol) may also lower cholesterol. You should eat at least 2 g per day of these foods for a cholesterol lowering effect.  Read package labels to identify low-saturated fats, trans fat free, and low-fat foods at the supermarket. Select cheeses that have only 2 to 3 g saturated fat per ounce. Use a heart-healthy tub margarine that is free of trans fats or partially hydrogenated oil. When buying baked goods (cookies, crackers), avoid partially hydrogenated oils. Breads and muffins should be made from whole grains (whole-wheat or whole oat flour, instead of "flour" or "enriched flour"). Buy non-creamy canned soups with reduced salt and no added fats.  FOOD PREPARATION TECHNIQUES  Never deep-fry. If you must fry, either stir-fry, which uses very little fat, or use non-stick cooking sprays. When possible, broil, bake, or roast meats, and steam vegetables. Instead of putting butter or margarine on vegetables, use lemon and herbs, applesauce, and cinnamon (for squash and sweet potatoes). Use nonfat   yogurt, salsa, and low-fat dressings for salads.  LOW-SATURATED FAT / LOW-FAT FOOD SUBSTITUTES Meats / Saturated Fat (g)  Avoid: Steak, marbled (3 oz/85 g) / 11 g  Choose: Steak, lean (3 oz/85 g) / 4 g  Avoid: Hamburger (3 oz/85 g) / 7  g  Choose: Hamburger, lean (3 oz/85 g) / 5 g  Avoid: Ham (3 oz/85 g) / 6 g  Choose: Ham, lean cut (3 oz/85 g) / 2.4 g  Avoid: Chicken, with skin, dark meat (3 oz/85 g) / 4 g  Choose: Chicken, skin removed, dark meat (3 oz/85 g) / 2 g  Avoid: Chicken, with skin, light meat (3 oz/85 g) / 2.5 g  Choose: Chicken, skin removed, light meat (3 oz/85 g) / 1 g Dairy / Saturated Fat (g)  Avoid: Whole milk (1 cup) / 5 g  Choose: Low-fat milk, 2% (1 cup) / 3 g  Choose: Low-fat milk, 1% (1 cup) / 1.5 g  Choose: Skim milk (1 cup) / 0.3 g  Avoid: Hard cheese (1 oz/28 g) / 6 g  Choose: Skim milk cheese (1 oz/28 g) / 2 to 3 g  Avoid: Cottage cheese, 4% fat (1 cup) / 6.5 g  Choose: Low-fat cottage cheese, 1% fat (1 cup) / 1.5 g  Avoid: Ice cream (1 cup) / 9 g  Choose: Sherbet (1 cup) / 2.5 g  Choose: Nonfat frozen yogurt (1 cup) / 0.3 g  Choose: Frozen fruit bar / trace  Avoid: Whipped cream (1 tbs) / 3.5 g  Choose: Nondairy whipped topping (1 tbs) / 1 g Condiments / Saturated Fat (g)  Avoid: Mayonnaise (1 tbs) / 2 g  Choose: Low-fat mayonnaise (1 tbs) / 1 g  Avoid: Butter (1 tbs) / 7 g  Choose: Extra light margarine (1 tbs) / 1 g  Avoid: Coconut oil (1 tbs) / 11.8 g  Choose: Olive oil (1 tbs) / 1.8 g  Choose: Corn oil (1 tbs) / 1.7 g  Choose: Safflower oil (1 tbs) / 1.2 g  Choose: Sunflower oil (1 tbs) / 1.4 g  Choose: Soybean oil (1 tbs) / 2.4 g  Choose: Canola oil (1 tbs) / 1 g Document Released: 04/22/2005 Document Revised: 08/17/2012 Document Reviewed: 07/21/2013 [employer] Patient Information 2015 [employer]. This information is not intended to replace advice given to you by your health care provider. Make sure you discuss any questions you have with your health care provider.  

## 2014-09-20 LAB — HIV ANTIBODY (ROUTINE TESTING W REFLEX): HIV SCREEN 4TH GENERATION: NONREACTIVE

## 2014-09-20 LAB — CMP14+EGFR
ALBUMIN: 4.1 g/dL (ref 3.5–5.5)
ALK PHOS: 53 IU/L (ref 39–117)
ALT: 11 IU/L (ref 0–32)
AST: 10 IU/L (ref 0–40)
Albumin/Globulin Ratio: 1.3 (ref 1.1–2.5)
BILIRUBIN TOTAL: 0.2 mg/dL (ref 0.0–1.2)
BUN / CREAT RATIO: 19 (ref 8–20)
BUN: 14 mg/dL (ref 6–20)
CO2: 20 mmol/L (ref 18–29)
Calcium: 9.3 mg/dL (ref 8.7–10.2)
Chloride: 101 mmol/L (ref 97–108)
Creatinine, Ser: 0.74 mg/dL (ref 0.57–1.00)
GFR calc non Af Amer: 108 mL/min/{1.73_m2} (ref >59)
GFR, EST AFRICAN AMERICAN: 125 mL/min/{1.73_m2} (ref >59)
GLUCOSE: 85 mg/dL (ref 65–99)
Globulin, Total: 3.1 g/dL (ref 1.5–4.5)
POTASSIUM: 4.1 mmol/L (ref 3.5–5.2)
SODIUM: 139 mmol/L (ref 134–144)
Total Protein: 7.2 g/dL (ref 6.0–8.5)

## 2014-09-20 LAB — VITAMIN D 25 HYDROXY (VIT D DEFICIENCY, FRACTURES): Vit D, 25-Hydroxy: 25 ng/mL — ABNORMAL LOW (ref 30.0–100.0)

## 2014-09-20 LAB — PAP IG W/ RFLX HPV ASCU: PAP Smear Comment: 0

## 2014-09-20 LAB — THYROID PANEL WITH TSH
Free Thyroxine Index: 2.3 (ref 1.2–4.9)
T3 Uptake Ratio: 17 % — ABNORMAL LOW (ref 24–39)
T4, Total: 13.8 ug/dL — ABNORMAL HIGH (ref 4.5–12.0)
TSH: 1.5 u[IU]/mL (ref 0.450–4.500)

## 2014-09-20 LAB — SPECIMEN STATUS REPORT

## 2014-09-20 LAB — NMR, LIPOPROFILE
Cholesterol: 157 mg/dL (ref 100–199)
HDL CHOLESTEROL BY NMR: 67 mg/dL (ref >39)
HDL Particle Number: 47.1 umol/L (ref >=30.5)
LDL PARTICLE NUMBER: 1061 nmol/L — AB (ref <1000)
LDL Size: 20.4 nm (ref >20.5)
LDL-C: 64 mg/dL (ref 0–99)
LP-IR Score: 53 — ABNORMAL HIGH (ref <=45)
Small LDL Particle Number: 576 nmol/L — ABNORMAL HIGH (ref <=527)
Triglycerides by NMR: 132 mg/dL (ref 0–149)

## 2014-09-21 LAB — STD SCREEN (8)
HEP A IGM: NEGATIVE
HEP B C IGM: NEGATIVE
HEP B S AG: NEGATIVE
HIV SCREEN 4TH GENERATION: NONREACTIVE
HSV 1 Glycoprotein G Ab, IgG: 43.2 index — ABNORMAL HIGH (ref 0.00–0.90)
HSV 2 Glycoprotein G Ab, IgG: <0.91 index (ref 0.00–0.90)
Hep C Virus Ab: <0.1 s/co ratio (ref 0.0–0.9)
RPR: NONREACTIVE

## 2014-09-21 LAB — SPECIMEN STATUS REPORT

## 2014-09-22 ENCOUNTER — Telehealth: Payer: Self-pay | Admitting: *Deleted

## 2014-09-22 NOTE — Telephone Encounter (Signed)
-----   Message from [facility] IncMary-Margaret Martin, FNP sent at 09/21/2014 12:53 PM EDT ----- Cbc normal Kidney and liver function stable Cholesterol looks great t4 elevated- need to repeat in 3 months Vitamin d low- vitamin d 2000iu OTC daily PAP normal- repeat in 2 years

## 2014-09-22 NOTE — Telephone Encounter (Signed)
Pt notified of results Verbalizes understanding 

## 2014-09-22 NOTE — Telephone Encounter (Signed)
-----   Message from Mary-Margaret Martin, FNP sent at 09/21/2014 12:53 PM EDT ----- Cbc normal Kidney and liver function stable Cholesterol looks great t4 elevated- need to repeat in 3 months Vitamin d low- vitamin d 2000iu OTC daily PAP normal- repeat in 2 years 

## 2014-09-22 NOTE — Telephone Encounter (Signed)
-----   Message from Bennie PieriniMary-Margaret Martin, FNP sent at 09/22/2014  9:59 AM EDT ----- STD panel negative Herpes positive- for type one- fever blisters

## 2014-12-12 ENCOUNTER — Other Ambulatory Visit: Payer: BC Managed Care – PPO

## 2014-12-21 ENCOUNTER — Other Ambulatory Visit (INDEPENDENT_AMBULATORY_CARE_PROVIDER_SITE_OTHER): Payer: BC Managed Care – PPO

## 2014-12-21 DIAGNOSIS — R799 Abnormal finding of blood chemistry, unspecified: Secondary | ICD-10-CM

## 2014-12-22 LAB — T4, FREE: FREE T4: 1.32 ng/dL (ref 0.82–1.77)

## 2015-01-10 ENCOUNTER — Telehealth: Payer: Self-pay | Admitting: Nurse Practitioner

## 2015-01-10 NOTE — Telephone Encounter (Signed)
Patient informed of results and voices understanding

## 2015-01-18 ENCOUNTER — Other Ambulatory Visit (INDEPENDENT_AMBULATORY_CARE_PROVIDER_SITE_OTHER): Payer: BC Managed Care – PPO

## 2015-01-18 DIAGNOSIS — R799 Abnormal finding of blood chemistry, unspecified: Secondary | ICD-10-CM | POA: Diagnosis not present

## 2015-01-18 DIAGNOSIS — R7989 Other specified abnormal findings of blood chemistry: Secondary | ICD-10-CM

## 2015-01-19 LAB — THYROID PANEL WITH TSH
Free Thyroxine Index: 2.6 (ref 1.2–4.9)
T3 Uptake Ratio: 18 % — ABNORMAL LOW (ref 24–39)
T4 TOTAL: 14.7 ug/dL — AB (ref 4.5–12.0)
TSH: 1.35 u[IU]/mL (ref 0.450–4.500)

## 2015-04-10 ENCOUNTER — Telehealth: Payer: Self-pay | Admitting: Nurse Practitioner

## 2015-06-16 ENCOUNTER — Other Ambulatory Visit: Payer: BC Managed Care – PPO

## 2015-06-22 ENCOUNTER — Ambulatory Visit (INDEPENDENT_AMBULATORY_CARE_PROVIDER_SITE_OTHER): Payer: BC Managed Care – PPO | Admitting: Nurse Practitioner

## 2015-06-22 ENCOUNTER — Encounter: Payer: Self-pay | Admitting: Nurse Practitioner

## 2015-06-22 VITALS — BP 127/86 | HR 77 | Temp 97.6°F | Ht 62.0 in | Wt 194.0 lb

## 2015-06-22 DIAGNOSIS — R946 Abnormal results of thyroid function studies: Secondary | ICD-10-CM

## 2015-06-22 NOTE — Progress Notes (Signed)
   Subjective:    Patient ID: Leslie Morton, female    DOB: Pallett 14, 1982, 35 y.o.   MRN: [id number]  HPI Patient in today fro follow up of hyperhyroidism- she is currently on nothing. Her last TSH was normal at 1.35 but her t4 was 14.7- she has occasional palpitations but has been working out lately and that is when she started feeling palpitations- but she has a fit bit now and she says she can be sitting doing nothing sometimes and her heart rate will be over 110. No SOB.    Review of Systems  Constitutional: Negative.   HENT: Negative.   Respiratory: Negative.   Cardiovascular: Negative.   Genitourinary: Negative.   Neurological: Negative.   Psychiatric/Behavioral: Negative.   All other systems reviewed and are negative.      Objective:   Physical Exam  Constitutional: She is oriented to person, place, and time. She appears well-developed and well-nourished.  Cardiovascular: Normal rate, regular rhythm and normal heart sounds.   Pulmonary/Chest: Effort normal.  Neurological: She is alert and oriented to person, place, and time.  Skin: Skin is warm.  Psychiatric: She has a normal mood and affect. Her behavior is normal. Judgment and thought content normal.   BP 127/86 mmHg  Pulse 77  Temp(Src) 97.6 F (36.4 C) (Oral)  Ht  (1.575 m)  Wt 194 lb (87.998 kg)  BMI 35.47 kg/m2      Assessment & Plan:   1. Abnormal results of thyroid function studies    Labs pending RTO prn  Mary-Margaret Daphine Deutscher, FNP

## 2015-06-22 NOTE — Patient Instructions (Signed)
Hyperthyroidism Hyperthyroidism is when the thyroid is too active (overactive). Your thyroid is a large gland that is located in your neck. The thyroid helps to control how your body uses food (metabolism). When your thyroid is overactive, it produces too much of a hormone called thyroxine.  CAUSES Causes of hyperthyroidism may include:  Graves disease. This is when your immune system attacks the thyroid gland. This is the most common cause.  Inflammation of the thyroid gland.  Tumor in the thyroid gland or somewhere else.  Excessive use of thyroid medicines, including:  Prescription thyroid supplement.  Herbal supplements that mimic thyroid hormones.  Solid or fluid-filled lumps within your thyroid gland (thyroid nodules).  Excessive ingestion of iodine. RISK FACTORS  Being female.  Having a family history of thyroid conditions. SIGNS AND SYMPTOMS Signs and symptoms of hyperthyroidism may include:  Nervousness.  Inability to tolerate heat.  Unexplained weight loss.  Diarrhea.  Change in the texture of hair or skin.  Heart skipping beats or making extra beats.  Rapid heart rate.  Loss of menstruation.  Shaky hands.  Fatigue.  Restlessness.  Increased appetite.  Sleep problems.  Enlarged thyroid gland or nodules. DIAGNOSIS  Diagnosis of hyperthyroidism may include:  Medical history and physical exam.  Blood tests.  Ultrasound tests. TREATMENT Treatment may include:  Medicines to control your thyroid.  Surgery to remove your thyroid.  Radiation therapy. HOME CARE INSTRUCTIONS   Take medicines only as directed by your health care provider.  Do not use any tobacco products, including cigarettes, chewing tobacco, or electronic cigarettes. If you need help quitting, ask your health care provider.  Do not exercise or do physical activity until your health care provider approves.  Keep all follow-up appointments as directed by your health care  provider. This is important. SEEK MEDICAL CARE IF:  Your symptoms do not get better with treatment.  You have fever.  You are taking thyroid replacement medicine and you:  Have depression.  Feel mentally and physically slow.  Have weight gain. SEEK IMMEDIATE MEDICAL CARE IF:   You have decreased alertness or a change in your awareness.  You have abdominal pain.  You feel dizzy.  You have a rapid heartbeat.  You have an irregular heartbeat.   This information is not intended to replace advice given to you by your health care provider. Make sure you discuss any questions you have with your health care provider.   Document Released: 04/22/2005 Document Revised: 05/13/2014 Document Reviewed: 09/07/2013 Elsevier Interactive Patient Education 2016 [employer]  

## 2015-06-23 ENCOUNTER — Other Ambulatory Visit: Payer: Self-pay | Admitting: Nurse Practitioner

## 2015-06-23 DIAGNOSIS — E0590 Thyrotoxicosis, unspecified without thyrotoxic crisis or storm: Secondary | ICD-10-CM

## 2015-06-23 LAB — THYROID PANEL WITH TSH
FREE THYROXINE INDEX: 2.5 (ref 1.2–4.9)
T3 Uptake Ratio: 18 % — ABNORMAL LOW (ref 24–39)
T4 TOTAL: 14 ug/dL — AB (ref 4.5–12.0)
TSH: 1.45 u[IU]/mL (ref 0.450–4.500)

## 2015-06-27 ENCOUNTER — Telehealth: Payer: Self-pay | Admitting: Nurse Practitioner

## 2015-07-19 ENCOUNTER — Encounter: Payer: Self-pay | Admitting: Endocrinology

## 2015-07-19 ENCOUNTER — Ambulatory Visit (INDEPENDENT_AMBULATORY_CARE_PROVIDER_SITE_OTHER): Payer: BC Managed Care – PPO | Admitting: Endocrinology

## 2015-07-19 VITALS — BP 120/76 | HR 84 | Temp 98.5°F | Ht 62.0 in | Wt 196.2 lb

## 2015-07-19 DIAGNOSIS — E041 Nontoxic single thyroid nodule: Secondary | ICD-10-CM

## 2015-07-19 DIAGNOSIS — R002 Palpitations: Secondary | ICD-10-CM | POA: Diagnosis not present

## 2015-07-19 NOTE — Progress Notes (Signed)
Subjective:    Patient ID: Leslie Morton, female    DOB: 15-Aug-1980, 35 y.o.   MRN: [id number]  HPI Pt reports 1 month of moderate palpitations in the chest, and assoc mood swings.  she has never been on therapy for this.  she has never had XRT to the anterior neck, or thyroid surgery.  she has never had thyroid imaging.  she does not consume kelp or any other prescribed or non-prescribed thyroid medication.  she has never been on amiodarone.  She has had intermittently elevated BP in the past.     Past Medical History  Diagnosis Date  . Vaginal Pap smear, abnormal     Past Surgical History  Procedure Laterality Date  . Wisdom tooth extraction      Social History   Social History  . Marital Status: Married    Spouse Name: N/A  . Number of Children: N/A  . Years of Education: N/A   Occupational History  . Not on file.   Social History Main Topics  . Smoking status: Never Smoker   . Smokeless tobacco: Not on file  . Alcohol Use: No  . Drug Use: No  . Sexual Activity: Not on file   Other Topics Concern  . Not on file   Social History Narrative    Current Outpatient Prescriptions on File Prior to Visit  Medication Sig Dispense Refill  . drospirenone-ethinyl estradiol (LORYNA) 3-0.02 MG tablet Take 1 tablet by mouth daily. 1 Package 11   No current facility-administered medications on file prior to visit.    No Known Allergies  Family History  Problem Relation Age of Onset  . Thyroid disease Neg Hx     BP 120/76 mmHg  Pulse 84  Temp(Src) 98.5 F (36.9 C) (Oral)  Ht 5\' 2"  (1.575 m)  Wt 196 lb 3.2 oz (88.996 kg)  BMI 35.88 kg/m2  SpO2 98%  Review of Systems denies weight loss, hoarseness, visual loss, chest pain, sob, diarrhea, polyuria, muscle weakness, excessive diaphoresis, heat intolerance, easy bruising, and rhinorrhea. She has intermittent headache, anxiety, and tremor.     Objective:   Physical Exam VS: see vs page GEN: no distress HEAD: head: no  deformity eyes: no periorbital swelling, no proptosis external nose and ears are normal mouth: no lesion seen.  No neurofibromata.  NECK: probable 2 cm right upper lobe thyroid nodule is noted.  CHEST WALL: no deformity LUNGS:  Clear to auscultation CV: reg rate and rhythm, no murmur ABD: abdomen is soft, nontender.  no hepatosplenomegaly.  not distended.  no hernia MUSCULOSKELETAL: muscle bulk and strength are grossly normal.  no obvious joint swelling.  gait is normal and steady EXTEMITIES: no deformity.  no ulcer on the feet.  feet are of normal color and temp.  no edema PULSES: dorsalis pedis intact bilat.  no carotid bruit NEURO:  cn 2-12 grossly intact.   readily moves all 4's.  sensation is intact to touch on the feet.  No tremor SKIN:  Normal texture and temperature.  No rash or suspicious lesion is visible.  No cafe-au-lait spots.  Not diaphoretic NODES:  None palpable at the neck.  PSYCH: alert, well-oriented.  Does not appear anxious nor depressed.   Lab Results  Component Value Date   TSH 1.450 06/22/2015   T4TOTAL 14.0* 06/22/2015      Assessment & Plan:  Thyroid nodule, new.  She probably needs bx Palpitations, and other sxs, new, uncertain etiology  Patient is advised the  following: Patient Instructions  A 24-HR urine test is requested for you today.  We'll let you know about the results. We can check a 24-HR heart monitor if you wish.   Let's check the ultrasound.  you will receive a phone call, about a day and time for an appointment. If the ultrasound confirms the nodule, you should come back for a biopsy.  It is easy and almost painless.

## 2015-07-19 NOTE — Patient Instructions (Signed)
A 24-HR urine test is requested for you today.  We'll let you know about the results. We can check a 24-HR heart monitor if you wish.   Let's check the ultrasound.  you will receive a phone call, about a day and time for an appointment. If the ultrasound confirms the nodule, you should come back for a biopsy.  It is easy and almost painless.

## 2015-07-19 NOTE — Progress Notes (Signed)
Pre visit review using our clinic review tool, if applicable. No additional management support is needed unless otherwise documented below in the visit note. 

## 2015-07-21 NOTE — Addendum Note (Signed)
Addended by: Adline MangoSTONE-ELMORE, Kito Cuffe I on: 07/21/2015 09:27 AM   Modules accepted: Orders

## 2015-07-25 LAB — CATECHOLAMINES, FRACTIONATED, URINE, 24 HOUR
CREATININE, URINE MG/DAY-CATEUR: 1.38 g/(24.h) (ref 0.63–2.50)
Calculated Total (E+NE): 26 mcg/24 h (ref 26–121)
DOPAMINE, 24 HR URINE: 150 ug/(24.h) (ref 52–480)
Norepinephrine, 24 hr Ur: 26 mcg/24 h (ref 15–100)
Total Volume - CF 24Hr U: 1490 mL

## 2015-07-25 LAB — METANEPHRINES, URINE, 24 HOUR
METANEPH TOTAL UR: 258 ug/(24.h) (ref 115–695)
Metanephrines, Ur: 75 mcg/24 h (ref 36–190)
NORMETANEPHRINE 24H UR: 183 ug/(24.h) (ref 35–482)

## 2015-07-26 ENCOUNTER — Ambulatory Visit
Admission: RE | Admit: 2015-07-26 | Discharge: 2015-07-26 | Disposition: A | Discharge status: Home / Self Care | Payer: BC Managed Care – PPO | Source: Ambulatory Visit | Attending: Endocrinology | Admitting: Endocrinology

## 2015-07-26 DIAGNOSIS — E041 Nontoxic single thyroid nodule: Secondary | ICD-10-CM

## 2015-08-08 ENCOUNTER — Ambulatory Visit (INDEPENDENT_AMBULATORY_CARE_PROVIDER_SITE_OTHER): Payer: BC Managed Care – PPO | Admitting: Endocrinology

## 2015-08-08 ENCOUNTER — Encounter: Payer: Self-pay | Admitting: Endocrinology

## 2015-08-08 ENCOUNTER — Other Ambulatory Visit (HOSPITAL_COMMUNITY)
Admission: RE | Admit: 2015-08-08 | Discharge: 2015-08-08 | Disposition: A | Discharge status: Home / Self Care | Payer: BC Managed Care – PPO | Source: Ambulatory Visit | Attending: Endocrinology | Admitting: Endocrinology

## 2015-08-08 VITALS — BP 124/84 | HR 79 | Temp 98.1°F | Ht 62.0 in | Wt 194.0 lb

## 2015-08-08 DIAGNOSIS — E041 Nontoxic single thyroid nodule: Secondary | ICD-10-CM

## 2015-08-08 NOTE — Progress Notes (Signed)
   Subjective:    Patient ID: Leslie Morton, female    DOB: Jun 04, 1980, 35 y.o.   MRN: [id number]  HPI  (procedure only)  Review of Systems     Objective:   Physical Exam  thyroid needle bx:  consent obtained, signed form on chart.  The area is first sprayed with cooling agent local: xylocaine 2%, with epinephrine prep: alcohol pad 4 bxs are done with 25 and 27g needles no complications      Assessment & Plan:

## 2015-08-08 NOTE — Patient Instructions (Signed)
We'll let you know about the biopsy results. Please come back for a follow-up appointment in 6 months 

## 2015-08-17 ENCOUNTER — Other Ambulatory Visit: Payer: Self-pay | Admitting: Nurse Practitioner

## 2015-09-28 ENCOUNTER — Other Ambulatory Visit: Payer: Self-pay | Admitting: Nurse Practitioner

## 2015-09-28 ENCOUNTER — Other Ambulatory Visit: Payer: BC Managed Care – PPO | Admitting: Nurse Practitioner

## 2015-09-28 MED ORDER — DROSPIRENONE-ETHINYL ESTRADIOL 3-0.02 MG PO TABS: 1.0000 | ORAL_TABLET | Freq: Every day | ORAL | Status: DC

## 2015-09-28 NOTE — Telephone Encounter (Signed)
Patient aware.

## 2015-09-28 NOTE — Telephone Encounter (Signed)
BC I sent the Rx to the pharmacy.

## 2015-11-21 ENCOUNTER — Encounter: Payer: Self-pay | Admitting: Nurse Practitioner

## 2015-11-21 ENCOUNTER — Ambulatory Visit (INDEPENDENT_AMBULATORY_CARE_PROVIDER_SITE_OTHER): Payer: BC Managed Care – PPO | Admitting: Nurse Practitioner

## 2015-11-21 VITALS — BP 120/82 | HR 86 | Temp 97.6°F | Ht 62.0 in | Wt 194.0 lb

## 2015-11-21 DIAGNOSIS — Z01419 Encounter for gynecological examination (general) (routine) without abnormal findings: Secondary | ICD-10-CM

## 2015-11-21 DIAGNOSIS — E041 Nontoxic single thyroid nodule: Secondary | ICD-10-CM

## 2015-11-21 DIAGNOSIS — Z0000 Encounter for general adult medical examination without abnormal findings: Secondary | ICD-10-CM

## 2015-11-21 LAB — URINALYSIS, COMPLETE
BILIRUBIN UA: NEGATIVE
GLUCOSE, UA: NEGATIVE
Ketones, UA: NEGATIVE
Leukocytes, UA: NEGATIVE
Nitrite, UA: NEGATIVE
PH UA: 5.5 (ref 5.0–7.5)
PROTEIN UA: NEGATIVE
RBC UA: NEGATIVE
SPEC GRAV UA: 1.02 (ref 1.005–1.030)
UUROB: 0.2 mg/dL (ref 0.2–1.0)

## 2015-11-21 LAB — MICROSCOPIC EXAMINATION

## 2015-11-21 MED ORDER — DROSPIRENONE-ETHINYL ESTRADIOL 3-0.02 MG PO TABS
1.0000 | ORAL_TABLET | Freq: Every day | ORAL | Status: DC
Start: 2015-11-21 — End: 2016-10-12

## 2015-11-21 NOTE — Progress Notes (Signed)
   Subjective:    Patient ID: Leslie Morton, female    DOB: 02/28/1981, 35 y.o.   MRN: [id number]  HPI  Patient here today for annual physical exam and pap. She is doing well today without complaints. Only rx she is on is birth control. She has no current medical problems.  * has thyroid nodule that they are just monitoring for now.   Review of Systems  Constitutional: Negative.   HENT: Negative.   Respiratory: Negative.   Cardiovascular: Negative.   Gastrointestinal: Negative.   Genitourinary: Negative.   Neurological: Negative.   Psychiatric/Behavioral: Negative.   All other systems reviewed and are negative.      Objective:   Physical Exam  Constitutional: She is oriented to person, place, and time. She appears well-developed and well-nourished.  HENT:  Head: Normocephalic.  Right Ear: Hearing, tympanic membrane, external ear and ear canal normal.  Left Ear: Hearing, tympanic membrane, external ear and ear canal normal.  Nose: Nose normal.  Mouth/Throat: Uvula is midline and oropharynx is clear and moist.  Eyes: Conjunctivae and EOM are normal. Pupils are equal, round, and reactive to light.  Neck: Normal range of motion and full passive range of motion without pain. Neck supple. No JVD present. Carotid bruit is not present. No thyroid mass and no thyromegaly present.  Cardiovascular: Normal rate, normal heart sounds and intact distal pulses.   No murmur heard. Pulmonary/Chest: Effort normal and breath sounds normal. Right breast exhibits no inverted nipple, no mass, no nipple discharge, no skin change and no tenderness. Left breast exhibits no inverted nipple, no mass, no nipple discharge, no skin change and no tenderness.  Abdominal: Soft. Bowel sounds are normal. She exhibits no mass. There is no tenderness.  Genitourinary: Vagina normal and uterus normal. No breast swelling, tenderness, discharge or bleeding.  bimanual exam-No adnexal masses or tenderness. Cervix parous and  pink- no vaginal discharge   Musculoskeletal: Normal range of motion.  Lymphadenopathy:    She has no cervical adenopathy.  Neurological: She is alert and oriented to person, place, and time.  Skin: Skin is warm and dry.  Psychiatric: She has a normal mood and affect. Her behavior is normal. Judgment and thought content normal.   BP 120/82 mmHg  Pulse 86  Temp(Src) 97.6 F (36.4 C) (Oral)  Ht '5\' 2"'$  (1.575 m)  Wt 194 lb (87.998 kg)  BMI 35.47 kg/m2        Assessment & Plan:  1. Annual physical exam - Urinalysis, Complete - CMP14+EGFR - CBC with Differential/Platelet - Lipid panel - Thyroid Panel With TSH  2. Encounter for routine gynecological examination - Pap IG w/ reflex to HPV when ASC-U  3. Right thyroid nodule Make sure check nodules yearly    Labs pending Health maintenance reviewed Diet and exercise encouraged Continue all meds Follow up  In 1 year   [place], FNP

## 2015-11-21 NOTE — Patient Instructions (Signed)
Health Maintenance, Female Adopting a healthy lifestyle and getting preventive care can go a long way to promote health and wellness. Talk with your health care provider about what schedule of regular examinations is right for you. This is a good chance for you to check in with your provider about disease prevention and staying healthy. In between checkups, there are plenty of things you can do on your own. Experts have done a lot of research about which lifestyle changes and preventive measures are most likely to keep you healthy. Ask your health care provider for more information. WEIGHT AND DIET  Eat a healthy diet  Be sure to include plenty of vegetables, fruits, low-fat dairy products, and lean protein.  Do not eat a lot of foods high in solid fats, added sugars, or salt.  Get regular exercise. This is one of the most important things you can do for your health.  Most adults should exercise for at least 150 minutes each week. The exercise should increase your heart rate and make you sweat (moderate-intensity exercise).  Most adults should also do strengthening exercises at least twice a week. This is in addition to the moderate-intensity exercise.  Maintain a healthy weight  Body mass index (BMI) is a measurement that can be used to identify possible weight problems. It estimates body fat based on height and weight. Your health care provider can help determine your BMI and help you achieve or maintain a healthy weight.  For females 20 years of age and older:   A BMI below 18.5 is considered underweight.  A BMI of 18.5 to 24.9 is normal.  A BMI of 25 to 29.9 is considered overweight.  A BMI of 30 and above is considered obese.  Watch levels of cholesterol and blood lipids  You should start having your blood tested for lipids and cholesterol at 33 years of age, then have this test every 5 years.  You may need to have your cholesterol levels checked more often if:  Your lipid  or cholesterol levels are high.  You are older than 33 years of age.  You are at high risk for heart disease.  CANCER SCREENING   Lung Cancer  Lung cancer screening is recommended for adults 55-80 years old who are at high risk for lung cancer because of a history of smoking.  A yearly low-dose CT scan of the lungs is recommended for people who:  Currently smoke.  Have quit within the past 15 years.  Have at least a 30-pack-year history of smoking. A pack year is smoking an average of one pack of cigarettes a day for 1 year.  Yearly screening should continue until it has been 15 years since you quit.  Yearly screening should stop if you develop a health problem that would prevent you from having lung cancer treatment.  Breast Cancer  Practice breast self-awareness. This means understanding how your breasts normally appear and feel.  It also means doing regular breast self-exams. Let your health care provider know about any changes, no matter how small.  If you are in your 20s or 30s, you should have a clinical breast exam (CBE) by a health care provider every 1-3 years as part of a regular health exam.  If you are 40 or older, have a CBE every year. Also consider having a breast X-ray (mammogram) every year.  If you have a family history of breast cancer, talk to your health care provider about genetic screening.  If you   are at high risk for breast cancer, talk to your health care provider about having an MRI and a mammogram every year.  Breast cancer gene (BRCA) assessment is recommended for women who have family members with BRCA-related cancers. BRCA-related cancers include:  Breast.  Ovarian.  Tubal.  Peritoneal cancers.  Results of the assessment will determine the need for genetic counseling and BRCA1 and BRCA2 testing. Cervical Cancer Your health care provider may recommend that you be screened regularly for cancer of the pelvic organs (ovaries, uterus, and  vagina). This screening involves a pelvic examination, including checking for microscopic changes to the surface of your cervix (Pap test). You may be encouraged to have this screening done every 3 years, beginning at age 21.  For women ages 30-65, health care providers may recommend pelvic exams and Pap testing every 3 years, or they may recommend the Pap and pelvic exam, combined with testing for human papilloma virus (HPV), every 5 years. Some types of HPV increase your risk of cervical cancer. Testing for HPV may also be done on women of any age with unclear Pap test results.  Other health care providers may not recommend any screening for nonpregnant women who are considered low risk for pelvic cancer and who do not have symptoms. Ask your health care provider if a screening pelvic exam is right for you.  If you have had past treatment for cervical cancer or a condition that could lead to cancer, you need Pap tests and screening for cancer for at least 20 years after your treatment. If Pap tests have been discontinued, your risk factors (such as having a new sexual partner) need to be reassessed to determine if screening should resume. Some women have medical problems that increase the chance of getting cervical cancer. In these cases, your health care provider may recommend more frequent screening and Pap tests. Colorectal Cancer  This type of cancer can be detected and often prevented.  Routine colorectal cancer screening usually begins at 33 years of age and continues through 33 years of age.  Your health care provider may recommend screening at an earlier age if you have risk factors for colon cancer.  Your health care provider may also recommend using home test kits to check for hidden blood in the stool.  A small camera at the end of a tube can be used to examine your colon directly (sigmoidoscopy or colonoscopy). This is done to check for the earliest forms of colorectal  cancer.  Routine screening usually begins at age 50.  Direct examination of the colon should be repeated every 5-10 years through 33 years of age. However, you may need to be screened more often if early forms of precancerous polyps or small growths are found. Skin Cancer  Check your skin from head to toe regularly.  Tell your health care provider about any new moles or changes in moles, especially if there is a change in a mole's shape or color.  Also tell your health care provider if you have a mole that is larger than the size of a pencil eraser.  Always use sunscreen. Apply sunscreen liberally and repeatedly throughout the day.  Protect yourself by wearing long sleeves, pants, a wide-brimmed hat, and sunglasses whenever you are outside. HEART DISEASE, DIABETES, AND HIGH BLOOD PRESSURE   High blood pressure causes heart disease and increases the risk of stroke. High blood pressure is more likely to develop in:  People who have blood pressure in the high end   of the normal range (130-139/85-89 mm Hg).  People who are overweight or obese.  People who are African American.  If you are 38-23 years of age, have your blood pressure checked every 3-5 years. If you are 61 years of age or older, have your blood pressure checked every year. You should have your blood pressure measured twice--once when you are at a hospital or clinic, and once when you are not at a hospital or clinic. Record the average of the two measurements. To check your blood pressure when you are not at a hospital or clinic, you can use:  An automated blood pressure machine at a pharmacy.  A home blood pressure monitor.  If you are between 45 years and 39 years old, ask your health care provider if you should take aspirin to prevent strokes.  Have regular diabetes screenings. This involves taking a blood sample to check your fasting blood sugar level.  If you are at a normal weight and have a low risk for diabetes,  have this test once every three years after 33 years of age.  If you are overweight and have a high risk for diabetes, consider being tested at a younger age or more often. PREVENTING INFECTION  Hepatitis B  If you have a higher risk for hepatitis B, you should be screened for this virus. You are considered at high risk for hepatitis B if:  You were born in a country where hepatitis B is common. Ask your health care provider which countries are considered high risk.  Your parents were born in a high-risk country, and you have not been immunized against hepatitis B (hepatitis B vaccine).  You have HIV or AIDS.  You use needles to inject street drugs.  You live with someone who has hepatitis B.  You have had sex with someone who has hepatitis B.  You get hemodialysis treatment.  You take certain medicines for conditions, including cancer, organ transplantation, and autoimmune conditions. Hepatitis C  Blood testing is recommended for:  Everyone born from [birth year] through 1963.  Anyone with known risk factors for hepatitis C. Sexually transmitted infections (STIs)  You should be screened for sexually transmitted infections (STIs) including gonorrhea and chlamydia if:  You are sexually active and are younger than 33 years of age.  You are older than 33 years of age and your health care provider tells you that you are at risk for this type of infection.  Your sexual activity has changed since you were last screened and you are at an increased risk for chlamydia or gonorrhea. Ask your health care provider if you are at risk.  If you do not have HIV, but are at risk, it may be recommended that you take a prescription medicine daily to prevent HIV infection. This is called pre-exposure prophylaxis (PrEP). You are considered at risk if:  You are sexually active and do not regularly use condoms or know the HIV status of your partner(s).  You take drugs by injection.  You are sexually  active with a partner who has HIV. Talk with your health care provider about whether you are at high risk of being infected with HIV. If you choose to begin PrEP, you should first be tested for HIV. You should then be tested every 3 months for as long as you are taking PrEP.  PREGNANCY   If you are premenopausal and you may become pregnant, ask your health care provider about preconception counseling.  If you may  become pregnant, take 400 to 800 micrograms (mcg) of folic acid every day.  If you want to prevent pregnancy, talk to your health care provider about birth control (contraception). OSTEOPOROSIS AND MENOPAUSE   Osteoporosis is a disease in which the bones lose minerals and strength with aging. This can result in serious bone fractures. Your risk for osteoporosis can be identified using a bone density scan.  If you are 61 years of age or older, or if you are at risk for osteoporosis and fractures, ask your health care provider if you should be screened.  Ask your health care provider whether you should take a calcium or vitamin D supplement to lower your risk for osteoporosis.  Menopause may have certain physical symptoms and risks.  Hormone replacement therapy may reduce some of these symptoms and risks. Talk to your health care provider about whether hormone replacement therapy is right for you.  HOME CARE INSTRUCTIONS   Schedule regular health, dental, and eye exams.  Stay current with your immunizations.   Do not use any tobacco products including cigarettes, chewing tobacco, or electronic cigarettes.  If you are pregnant, do not drink alcohol.  If you are breastfeeding, limit how much and how often you drink alcohol.  Limit alcohol intake to no more than 1 drink per day for nonpregnant women. One drink equals 12 ounces of beer, 5 ounces of wine, or 1 ounces of hard liquor.  Do not use street drugs.  Do not share needles.  Ask your health care provider for help if  you need support or information about quitting drugs.  Tell your health care provider if you often feel depressed.  Tell your health care provider if you have ever been abused or do not feel safe at home.   This information is not intended to replace advice given to you by your health care provider. Make sure you discuss any questions you have with your health care provider.   Document Released: 11/05/2010 Document Revised: 05/13/2014 Document Reviewed: 03/24/2013 Elsevier Interactive Patient Education [employer].

## 2015-11-22 LAB — CBC WITH DIFFERENTIAL/PLATELET
BASOS ABS: 0 10*3/uL (ref 0.0–0.2)
Basos: 0 %
EOS (ABSOLUTE): 0.3 10*3/uL (ref 0.0–0.4)
Eos: 3 %
Hematocrit: 42.2 % (ref 34.0–46.6)
Hemoglobin: 14 g/dL (ref 11.1–15.9)
Immature Grans (Abs): 0 10*3/uL (ref 0.0–0.1)
Immature Granulocytes: 0 %
LYMPHS ABS: 2.7 10*3/uL (ref 0.7–3.1)
Lymphs: 35 %
MCH: 29.3 pg (ref 26.6–33.0)
MCHC: 33.2 g/dL (ref 31.5–35.7)
MCV: 88 fL (ref 79–97)
Monocytes Absolute: 0.4 10*3/uL (ref 0.1–0.9)
Monocytes: 5 %
NEUTROS ABS: 4.4 10*3/uL (ref 1.4–7.0)
Neutrophils: 57 %
PLATELETS: 326 10*3/uL (ref 150–379)
RBC: 4.78 x10E6/uL (ref 3.77–5.28)
RDW: 12.5 % (ref 12.3–15.4)
WBC: 7.7 10*3/uL (ref 3.4–10.8)

## 2015-11-22 LAB — CMP14+EGFR
ALT: 14 IU/L (ref 0–32)
AST: 13 IU/L (ref 0–40)
Albumin/Globulin Ratio: 1.7 (ref 1.2–2.2)
Albumin: 4.5 g/dL (ref 3.5–5.5)
Alkaline Phosphatase: 44 IU/L (ref 39–117)
BILIRUBIN TOTAL: 0.3 mg/dL (ref 0.0–1.2)
BUN / CREAT RATIO: 24 — AB (ref 9–23)
BUN: 19 mg/dL (ref 6–20)
CHLORIDE: 99 mmol/L (ref 96–106)
CO2: 21 mmol/L (ref 18–29)
Calcium: 9.9 mg/dL (ref 8.7–10.2)
Creatinine, Ser: 0.78 mg/dL (ref 0.57–1.00)
GFR calc Af Amer: 116 mL/min/{1.73_m2} (ref >59)
GFR calc non Af Amer: 101 mL/min/{1.73_m2} (ref >59)
GLUCOSE: 89 mg/dL (ref 65–99)
Globulin, Total: 2.7 g/dL (ref 1.5–4.5)
Potassium: 4.2 mmol/L (ref 3.5–5.2)
Sodium: 139 mmol/L (ref 134–144)
Total Protein: 7.2 g/dL (ref 6.0–8.5)

## 2015-11-22 LAB — THYROID PANEL WITH TSH
FREE THYROXINE INDEX: 2.6 (ref 1.2–4.9)
T3 UPTAKE RATIO: 19 % — AB (ref 24–39)
T4 TOTAL: 13.7 ug/dL — AB (ref 4.5–12.0)
TSH: 1.22 u[IU]/mL (ref 0.450–4.500)

## 2015-11-22 LAB — LIPID PANEL
Chol/HDL Ratio: 2.1 ratio units (ref 0.0–4.4)
Cholesterol, Total: 152 mg/dL (ref 100–199)
HDL: 73 mg/dL (ref >39)
LDL Calculated: 60 mg/dL (ref 0–99)
Triglycerides: 94 mg/dL (ref 0–149)
VLDL Cholesterol Cal: 19 mg/dL (ref 5–40)

## 2015-11-23 ENCOUNTER — Telehealth: Payer: Self-pay | Admitting: *Deleted

## 2015-11-23 LAB — PAP IG W/ RFLX HPV ASCU: PAP Smear Comment: 0

## 2015-11-23 NOTE — Telephone Encounter (Signed)
Pt notified of results Verbalizes understanding 

## 2015-11-23 NOTE — Telephone Encounter (Signed)
-----   Message from Bennie PieriniMary-Margaret Martin, FNP sent at 11/23/2015  3:07 PM EDT ----- Pap normal- repeat in 2 years

## 2015-12-01 ENCOUNTER — Ambulatory Visit (INDEPENDENT_AMBULATORY_CARE_PROVIDER_SITE_OTHER): Payer: BC Managed Care – PPO | Admitting: Nurse Practitioner

## 2015-12-01 VITALS — BP 130/82 | HR 72 | Temp 97.1°F | Ht 62.0 in | Wt 191.4 lb

## 2015-12-01 DIAGNOSIS — L239 Allergic contact dermatitis, unspecified cause: Secondary | ICD-10-CM

## 2015-12-01 DIAGNOSIS — L200 Besnier's prurigo: Secondary | ICD-10-CM

## 2015-12-01 MED ORDER — METHYLPREDNISOLONE ACETATE 80 MG/ML IJ SUSP
80.0000 mg | Freq: Once | INTRAMUSCULAR | Status: AC
  Administered 2015-12-01: 80 mg via INTRAMUSCULAR

## 2015-12-01 NOTE — Progress Notes (Signed)
   Subjective:    Patient ID: Leslie Morton, female    DOB: [birth year], 35 y.o.   MRN: [id number]  HPI Patient in today c/o rash started in corner of mouth and now has full blown rash- slight eye welling. whelp on right ear. Denies any change in diet - no new soaps or detergents. Denies tick bite.    Review of Systems  Constitutional: Negative.   HENT: Negative.   Respiratory: Negative.   Cardiovascular: Negative.   Gastrointestinal: Negative.   Genitourinary: Negative.   Neurological: Negative.   All other systems reviewed and are negative.      Objective:   Physical Exam  Constitutional: She is oriented to person, place, and time. She appears well-developed and well-nourished.  Cardiovascular: Normal rate, regular rhythm and normal heart sounds.   Pulmonary/Chest: Effort normal and breath sounds normal.  Neurological: She is alert and oriented to person, place, and time.  Skin: Skin is warm and dry. Rash (fine maculopapular rash bil cheeks, chin and right ear.) noted.  Psychiatric: She has a normal mood and affect. Her behavior is normal. Judgment and thought content normal.   BP 130/82   Pulse 72   Temp 97.1 F (36.2 C) (Oral)   Ht 5\' 2"  (1.575 m)   Wt 191 lb 6 oz (86.8 kg)   BMI 35.00 kg/m         Assessment & Plan:  1. Allergic dermatitis Continue benadryl OTC Keep food diary Avoid scratching RTO prn - methylPREDNISolone acetate (DEPO-MEDROL) injection 80 mg; Inject 1 mL (80 mg total) into the muscle once.  Mary-Margaret Daphine Deutscher, FNP

## 2015-12-01 NOTE — Patient Instructions (Signed)
Contact Dermatitis Dermatitis is redness, soreness, and swelling (inflammation) of the skin. Contact dermatitis is a reaction to certain substances that touch the skin. There are two types of contact dermatitis:   Irritant contact dermatitis. This type is caused by something that irritates your skin, such as dry hands from washing them too much. This type does not require previous exposure to the substance for a reaction to occur. This type is more common.  Allergic contact dermatitis. This type is caused by a substance that you are allergic to, such as a nickel allergy or poison ivy. This type only occurs if you have been exposed to the substance (allergen) before. Upon a repeat exposure, your body reacts to the substance. This type is less common. CAUSES  Many different substances can cause contact dermatitis. Irritant contact dermatitis is most commonly caused by exposure to:   Makeup.   Soaps.   Detergents.   Bleaches.   Acids.   Metal salts, such as nickel.  Allergic contact dermatitis is most commonly caused by exposure to:   Poisonous plants.   Chemicals.   Jewelry.   Latex.   Medicines.   Preservatives in products, such as clothing.  RISK FACTORS This condition is more likely to develop in:   People who have jobs that expose them to irritants or allergens.  People who have certain medical conditions, such as asthma or eczema.  SYMPTOMS  Symptoms of this condition may occur anywhere on your body where the irritant has touched you or is touched by you. Symptoms include:  Dryness or flaking.   Redness.   Cracks.   Itching.   Pain or a burning feeling.   Blisters.  Drainage of small amounts of blood or clear fluid from skin cracks. With allergic contact dermatitis, there may also be swelling in areas such as the eyelids, mouth, or genitals.  DIAGNOSIS  This condition is diagnosed with a medical history and physical exam. A patch skin test  may be performed to help determine the cause. If the condition is related to your job, you may need to see an occupational medicine specialist. TREATMENT Treatment for this condition includes figuring out what caused the reaction and protecting your skin from further contact. Treatment may also include:   Steroid creams or ointments. Oral steroid medicines may be needed in more severe cases.  Antibiotics or antibacterial ointments, if a skin infection is present.  Antihistamine lotion or an antihistamine taken by mouth to ease itching.  A bandage (dressing). HOME CARE INSTRUCTIONS Skin Care  Moisturize your skin as needed.   Apply cool compresses to the affected areas.  Try taking a bath with:  Epsom salts. Follow the instructions on the packaging. You can get these at your local pharmacy or grocery store.  Baking soda. Pour a small amount into the bath as directed by your health care provider.  Colloidal oatmeal. Follow the instructions on the packaging. You can get this at your local pharmacy or grocery store.  Try applying baking soda paste to your skin. Stir water into baking soda until it reaches a paste-like consistency.  Do not scratch your skin.  Bathe less frequently, such as every other day.  Bathe in lukewarm water. Avoid using hot water. Medicines  Take or apply over-the-counter and prescription medicines only as told by your health care provider.   If you were prescribed an antibiotic medicine, take or apply your antibiotic as told by your health care provider. Do not stop using the   antibiotic even if your condition starts to improve. General Instructions  Keep all follow-up visits as told by your health care provider. This is important.  Avoid the substance that caused your reaction. If you do not know what caused it, keep a journal to try to track what caused it. Write down:  What you eat.  What cosmetic products you use.  What you drink.  What  you wear in the affected area. This includes jewelry.  If you were given a dressing, take care of it as told by your health care provider. This includes when to change and remove it. SEEK MEDICAL CARE IF:   Your condition does not improve with treatment.  Your condition gets worse.  You have signs of infection such as swelling, tenderness, redness, soreness, or warmth in the affected area.  You have a fever.  You have new symptoms. SEEK IMMEDIATE MEDICAL CARE IF:   You have a severe headache, neck pain, or neck stiffness.  You vomit.  You feel very sleepy.  You notice red streaks coming from the affected area.  Your bone or joint underneath the affected area becomes painful after the skin has healed.  The affected area turns darker.  You have difficulty breathing.   This information is not intended to replace advice given to you by your health care provider. Make sure you discuss any questions you have with your health care provider.   Document Released: 04/19/2000 Document Revised: 01/11/2015 Document Reviewed: 09/07/2014 Elsevier Interactive Patient Education 2016 Elsevier Inc[employer]  

## 2015-12-02 ENCOUNTER — Encounter: Payer: Self-pay | Admitting: Pediatrics

## 2015-12-02 ENCOUNTER — Ambulatory Visit (INDEPENDENT_AMBULATORY_CARE_PROVIDER_SITE_OTHER): Payer: BC Managed Care – PPO | Admitting: Pediatrics

## 2015-12-02 VITALS — BP 124/82 | HR 92 | Temp 98.4°F | Ht 62.0 in | Wt 191.2 lb

## 2015-12-02 DIAGNOSIS — Z91018 Allergy to other foods: Secondary | ICD-10-CM | POA: Diagnosis not present

## 2015-12-02 DIAGNOSIS — R21 Rash and other nonspecific skin eruption: Secondary | ICD-10-CM | POA: Diagnosis not present

## 2015-12-02 DIAGNOSIS — T7840XD Allergy, unspecified, subsequent encounter: Secondary | ICD-10-CM

## 2015-12-02 MED ORDER — PREDNISONE 10 MG (21) PO TBPK: ORAL_TABLET | ORAL | 0 refills | Status: DC

## 2015-12-02 NOTE — Patient Instructions (Signed)
Need to be seen if getting worse, nausea, vomiting, difficulty breathing.

## 2015-12-02 NOTE — Progress Notes (Signed)
    Subjective:    Patient ID: Leslie Morton, female    DOB: [birth year], 35 y.o.   MRN: [id number]  CC: Allergic Reaction   HPI: Leslie Morton is a 35 y.o. female presenting for Allergic Reaction  Allergic reaction to poison ivy when younger Leslie Morton did it when she was younger Started Wednesday Started feeling tingling sensation in corners of mouth Felt tingling in eyes Started in the morning Had a mango that morning Throat was itchy later that night No changes in medicines, on the same OCP for years Kids have been on same multivitamin Today she feels a little worse than yesterdya, feels worn out L eye more swollen    Depression screen Leslie Morton 2/9 12/02/2015 12/01/2015 11/21/2015 07/19/2014  Decreased Interest 0 0 0 0  Down, Depressed, Hopeless 0 0 0 0  PHQ - 2 Score 0 0 0 0     Relevant past medical, surgical, family and social history reviewed and updated. Interim medical history since our last visit reviewed. Allergies and medications reviewed and updated.  History  Smoking Status  . Never Smoker  Smokeless Tobacco  . Never Used    ROS: Per HPI      Objective:    BP 124/82 (BP Location: Right Arm, Patient Position: Sitting, Cuff Size: Large)   Pulse 92   Temp 98.4 F (36.9 C) (Oral)   Ht 5\' 2"  (1.575 m)   Wt 191 lb 3.2 oz (86.7 kg)   BMI 34.97 kg/m   Wt Readings from Last 3 Encounters:  12/02/15 191 lb 3.2 oz (86.7 kg)  12/01/15 191 lb 6 oz (86.8 kg)  11/21/15 194 lb (88 kg)     Gen: NAD, alert, cooperative with exam, NCAT EYES: EOMI, puffy eye lids b/l, L>R ENT:  TMs pearly gray b/l, OP without erythema LYMPH: no cervical LAD CV: NRRR, normal S1/S2, no murmur, distal pulses 2+ b/l Resp: CTABL, no wheezes, normal WOB Abd: +BS, soft, NTND.  Neuro: Alert and oriented Skin: Patch of macular-papular rash behind R ear, patchy red rash splotchy over face, arms     Assessment & Plan:    Leslie Morton was seen today for allergic reaction. Treated yesterday with steroid shot,  still ongoing if not worse today. Will do steroid taper. Not clear what she was reacting to. Possibly mango as only new exposure, has had in past but was years ago. 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 No SOB, difficulty breathing, throat closing in, nausea, or vomiting.  Diagnoses and all orders for this visit:  Allergic reaction, subsequent encounter -     predniSONE (STERAPRED UNI-PAK 21 TAB) 10 MG (21) TBPK tablet; As directed x 6 days   Follow up plan: prn  Rex Kras, MD [place] 12/02/2015, 9:23 AM

## 2016-02-07 ENCOUNTER — Ambulatory Visit (INDEPENDENT_AMBULATORY_CARE_PROVIDER_SITE_OTHER): Payer: BC Managed Care – PPO | Admitting: Endocrinology

## 2016-02-07 ENCOUNTER — Encounter: Payer: Self-pay | Admitting: Endocrinology

## 2016-02-07 DIAGNOSIS — E042 Nontoxic multinodular goiter: Secondary | ICD-10-CM

## 2016-02-07 NOTE — Patient Instructions (Signed)
No medication is needed for the thyroid now. Let's recheck the ultrasound.  you will receive a phone call, about a day and time for an appointment. If there is no significant change, please return in 1 year.

## 2016-02-07 NOTE — Progress Notes (Signed)
   Subjective:    Patient ID: Leslie Morton, female    DOB: 1980/09/27, 35 y.o.   MRN: [id number]  HPI Pt returns for f/u of multinodular goiter (dx'ed 2017; bx of largest right nodule showed BENIGN FOLLICULAR NODULE (BETHESDA CATEGORY II); she has been euthyroid).  She does not notice the goiter.  No neck pain.   Past Medical History:  Diagnosis Date  . Multinodular goiter   . Vaginal Pap smear, abnormal     Past Surgical History:  Procedure Laterality Date  . WISDOM TOOTH EXTRACTION      Social History   Social History  . Marital status: Married    Spouse name: N/A  . Number of children: N/A  . Years of education: N/A   Occupational History  . Not on file.   Social History Main Topics  . Smoking status: Never Smoker  . Smokeless tobacco: Never Used  . Alcohol use No  . Drug use: No  . Sexual activity: Not on file   Other Topics Concern  . Not on file   Social History Narrative  . No narrative on file    Current Outpatient Prescriptions on File Prior to Visit  Medication Sig Dispense Refill  . Cholecalciferol (VITAMIN D3) 2000 units TABS Take 1 tablet by mouth daily.    . diphenhydrAMINE (BENADRYL) 25 mg capsule Take 25 mg by mouth every 6 (six) hours as needed for itching.    . drospirenone-ethinyl estradiol (LORYNA) 3-0.02 MG tablet Take 1 tablet by mouth daily. 28 tablet 11  . Multiple Vitamin (MULTI VITAMIN PO) Take 1 tablet by mouth daily.    . predniSONE (STERAPRED UNI-PAK 21 TAB) 10 MG (21) TBPK tablet As directed x 6 days 21 tablet 0   No current facility-administered medications on file prior to visit.     No Known Allergies  Family History  Problem Relation Age of Onset  . Thyroid disease Neg Hx     BP 118/82   Pulse 90   Wt 194 lb 3.2 oz (88.1 kg)   SpO2 97%   BMI 35.52 kg/m    Review of Systems Denies sob.      Objective:   Physical Exam VITAL SIGNS:  See vs page.   GENERAL: no distress.  NECK: probable 2 cm right upper lobe thyroid  nodule is noted.     Lab Results  Component Value Date   TSH 1.220 11/21/2015   T4TOTAL 13.7 (H) 11/21/2015      Assessment & Plan:  Multinodular goiter, clinically unchanged  Patient is advised the following: Patient Instructions  No medication is needed for the thyroid now. Let's recheck the ultrasound.  you will receive a phone call, about a day and time for an appointment. If there is no significant change, please return in 1 year.

## 2016-02-26 ENCOUNTER — Ambulatory Visit
Admission: RE | Admit: 2016-02-26 | Discharge: 2016-02-26 | Disposition: A | Discharge status: Home / Self Care | Payer: BC Managed Care – PPO | Source: Ambulatory Visit | Attending: Endocrinology | Admitting: Endocrinology

## 2016-02-26 DIAGNOSIS — E042 Nontoxic multinodular goiter: Secondary | ICD-10-CM

## 2016-03-24 IMAGING — US US SOFT TISSUE HEAD/NECK
1 series · 13 of 25 positions shown · non-contrast
Comparison: None.

CLINICAL DATA: 34-year-old female with thyroid nodule

EXAM:
THYROID ULTRASOUND
TECHNIQUE: Ultrasound examination of the thyroid gland and adjacent soft
tissues was performed.

[Series 1: us soft tissue head/neck · 0.08mm/px · 13 of 53 slices shown]
[im 1/53]
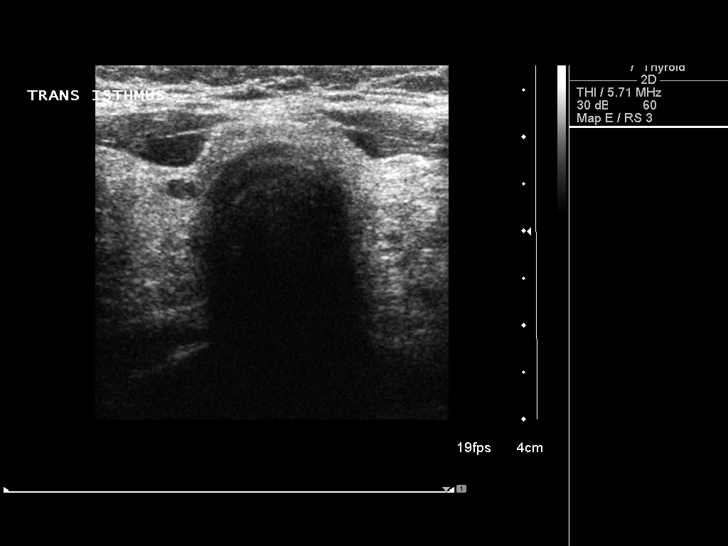
[im 5/53]
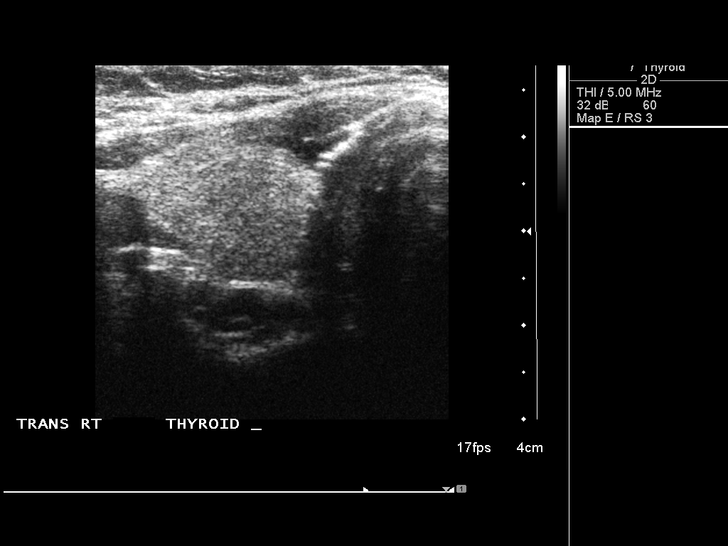
[im 9/53]
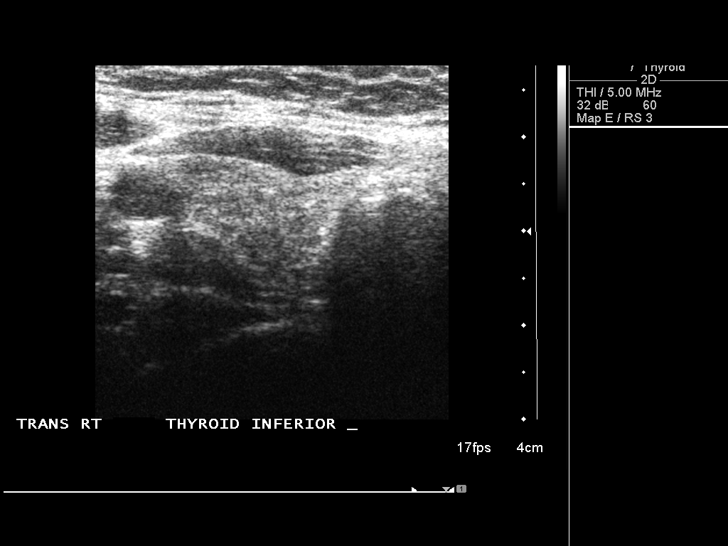
[im 14/53]
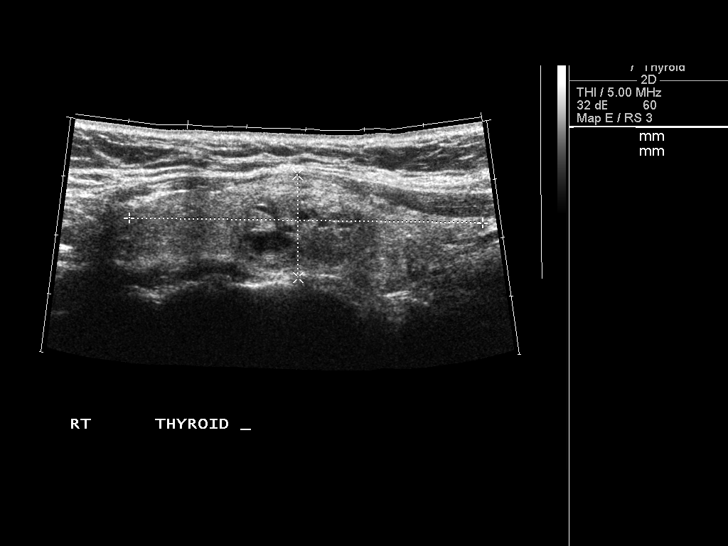
[im 18/53]
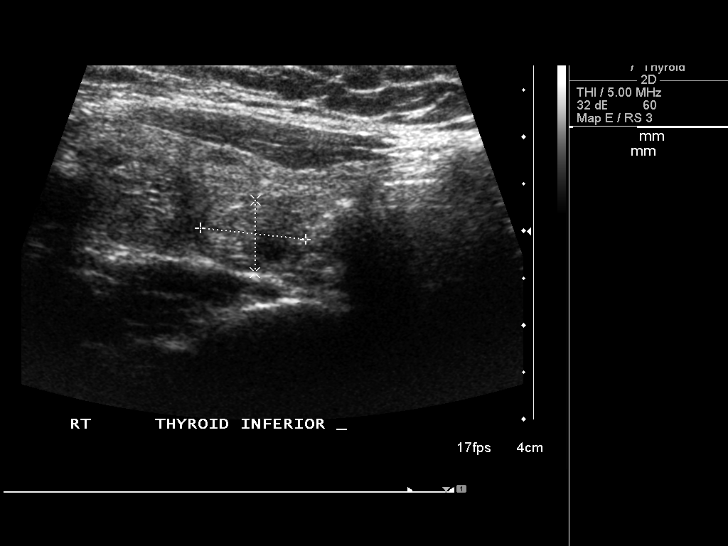
[im 22/53]
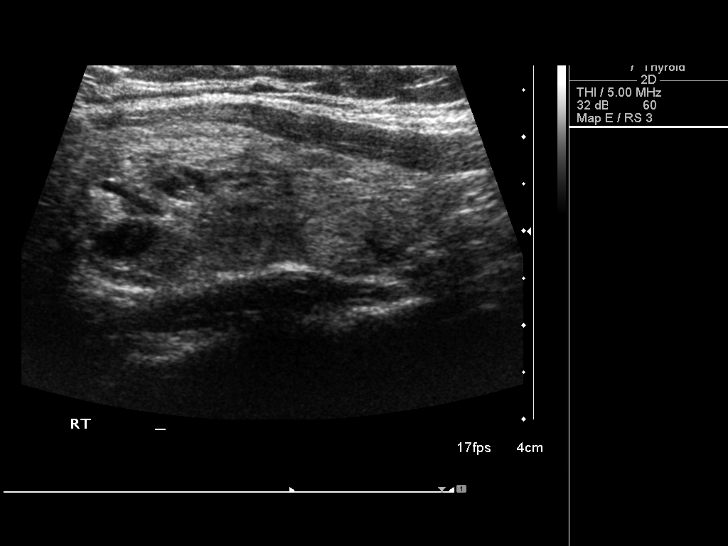
[im 27/53]
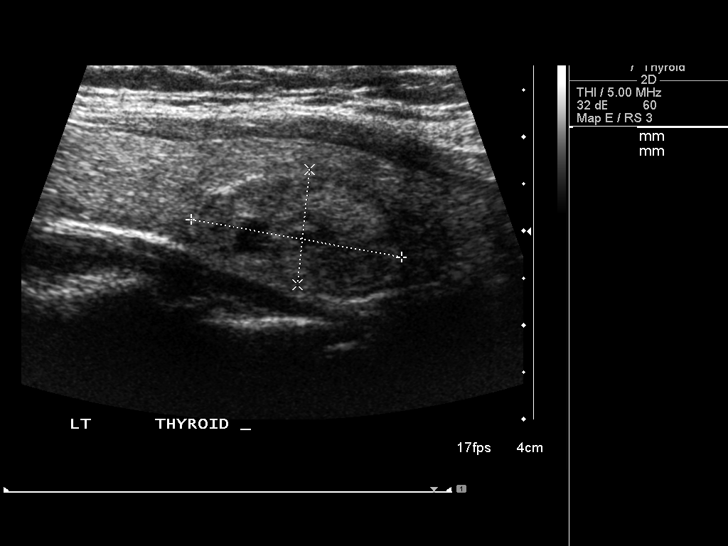
[im 31/53]
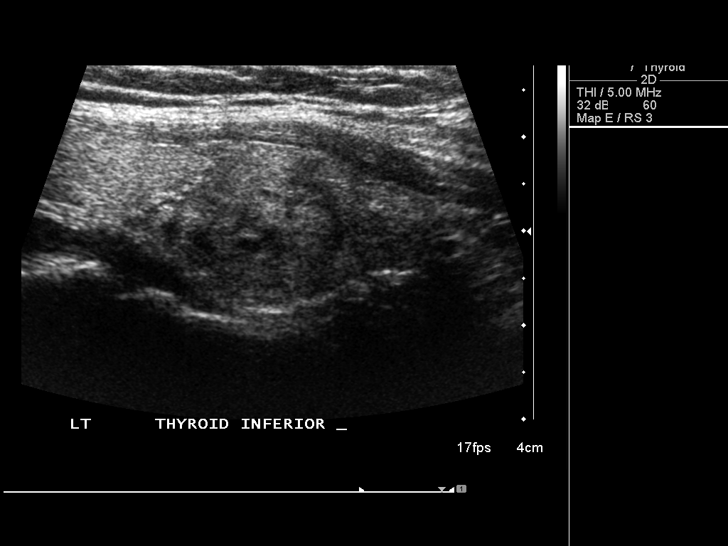
[im 35/53]
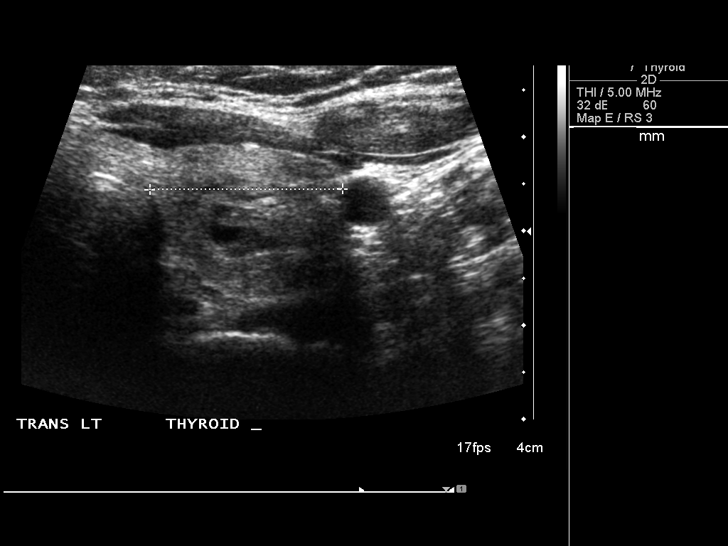
[im 40/53]
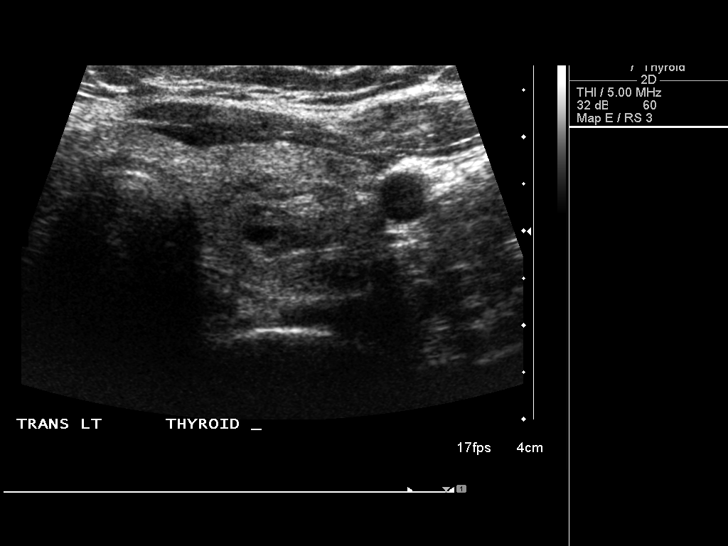
[im 44/53]
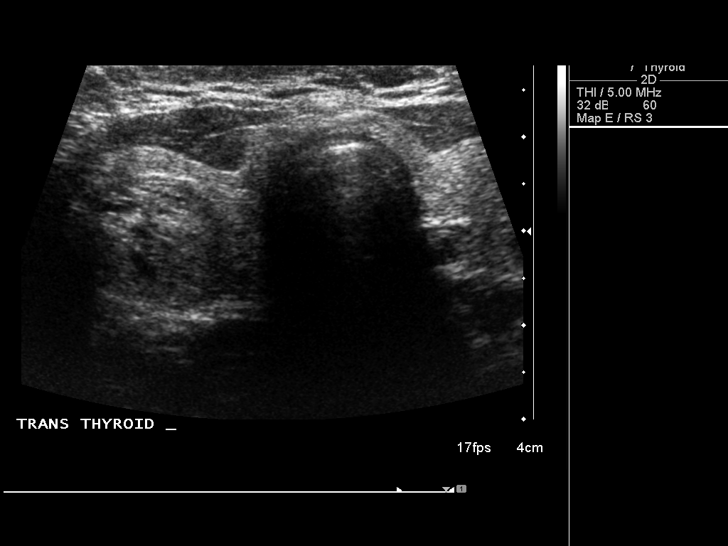
[im 48/53]
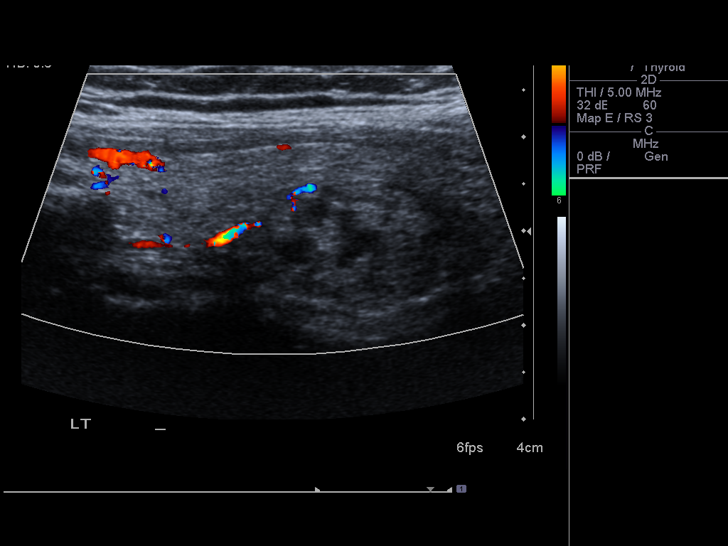
[im 53/53]
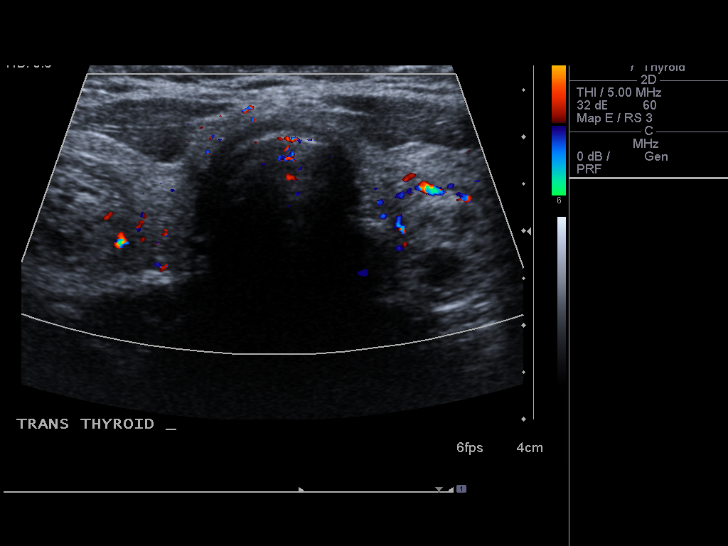

[13 of 25 positions shown; findings below may reference images not displayed]

FINDINGS: Right thyroid lobe

Measurements: 6.0 x 1.8 x 2.1 cm. Hypoechoic predominantly solid
nodule in the mid gland measures 2.7 x 1.4 x 1.6 cm. There is a
second smaller adjacent (but more inferior) hypoechoic predominantly
solid nodule measuring 1.1 x 0.7 x 0.9 cm.

Left thyroid lobe

Measurements: 5.5 x 1.7 x 2.1 cm. Hypoechoic solid nodule in the mid
gland measures 2.3 x 1.2 x 1.7 cm.

Isthmus

Thickness: 0.3 cm.  No nodules visualized.

Lymphadenopathy

None visualized.
IMPRESSION: 1. Bilateral thyroid nodules.
2. Both the 2.7 cm predominantly solid nodule in the right mid
gland, and the 2.3 cm predominantly solid nodule in the left mid
gland meet consensus criteria for further evaluation. Findings meet
consensus criteria for biopsy. Ultrasound-guided fine needle
aspiration should be considered, as per the consensus statement:
Management of Thyroid Nodules Detected at US: Society of
Radiologists in Ultrasound Consensus Conference Statement. Radiology

## 2016-09-23 ENCOUNTER — Ambulatory Visit (INDEPENDENT_AMBULATORY_CARE_PROVIDER_SITE_OTHER): Payer: BC Managed Care – PPO | Admitting: Family Medicine

## 2016-09-23 ENCOUNTER — Encounter: Payer: Self-pay | Admitting: Family Medicine

## 2016-09-23 VITALS — BP 134/88 | HR 103 | Temp 100.1°F | Ht 62.0 in | Wt 200.0 lb

## 2016-09-23 DIAGNOSIS — A493 Mycoplasma infection, unspecified site: Secondary | ICD-10-CM

## 2016-09-23 MED ORDER — BENZONATATE 200 MG PO CAPS: 200.0000 mg | ORAL_CAPSULE | Freq: Three times a day (TID) | ORAL | 0 refills | Status: DC | PRN

## 2016-09-23 MED ORDER — AZITHROMYCIN 250 MG PO TABS: ORAL_TABLET | ORAL | 0 refills | Status: DC

## 2016-09-23 NOTE — Progress Notes (Signed)
   HPI  Patient presents today for Patient presents with upper respiratory congestion. Rhinorrhea that is frequently clear but occasionally purulent. There is moderate sore throat. Patient reports coughing frequently as well.  No sputum noted. There is low-grade fever, chills or sweats. The patient denies being short of breath. Onset was 3 days ago. Gradually worsening. Tried OTCs without improvement. Her 34-year-old daughter has similar symptoms starting about a day before the patient.   PMH: Smoking status noted ROS: Per HPI  Objective: BP (!) 145/95   Pulse (!) 103   Temp 100.1 F (37.8 C) (Oral)   Ht 5\' 2"  (1.575 m)   Wt 200 lb (90.7 kg)   BMI 36.58 kg/m  Gen: NAD, alert, cooperative with exam HEENT: NCAT, EOMI, PERRL CV: RRR, good S1/S2, no murmur Resp: CTABL, mild bronchitic character, non-labored Abd: SNTND, BS present, no guarding or organomegaly Ext: No edema, warm Neuro: Alert and oriented, No gross deficits  Assessment and plan:  1. Infection, mycoplasma    Allergies as of 09/23/2016   No Known Allergies     Medication List       Accurate as of 09/23/16 10:04 AM. Always use your most recent med list.          azithromycin 250 MG tablet Commonly known as:  ZITHROMAX Z-PAK Take two right away Then one a day for the next 4 days.   benzonatate 200 MG capsule Commonly known as:  TESSALON Take 1 capsule (200 mg total) by mouth 3 (three) times daily as needed for cough.   drospirenone-ethinyl estradiol 3-0.02 MG tablet Commonly known as:  LORYNA Take 1 tablet by mouth daily.   MULTI VITAMIN PO Take 1 tablet by mouth daily.   Vitamin D3 2000 units Tabs Take 1 tablet by mouth daily.      Follow up as needed should symptoms not resolve promptly  Leslie ClaudeWarren Nikala Walsworth, MD

## 2016-10-12 ENCOUNTER — Other Ambulatory Visit: Payer: Self-pay | Admitting: Nurse Practitioner

## 2016-10-25 IMAGING — US US SOFT TISSUE HEAD/NECK
1 series · 13 of 25 positions shown · non-contrast
Comparison: None.

CLINICAL DATA: Thyroid nodules

EXAM:
THYROID ULTRASOUND
TECHNIQUE: Ultrasound examination of the thyroid gland and adjacent soft
tissues was performed.

[Series 1: us soft tissue head/neck · 0.07mm/px · 13 of 42 slices shown]
[im 1/42]
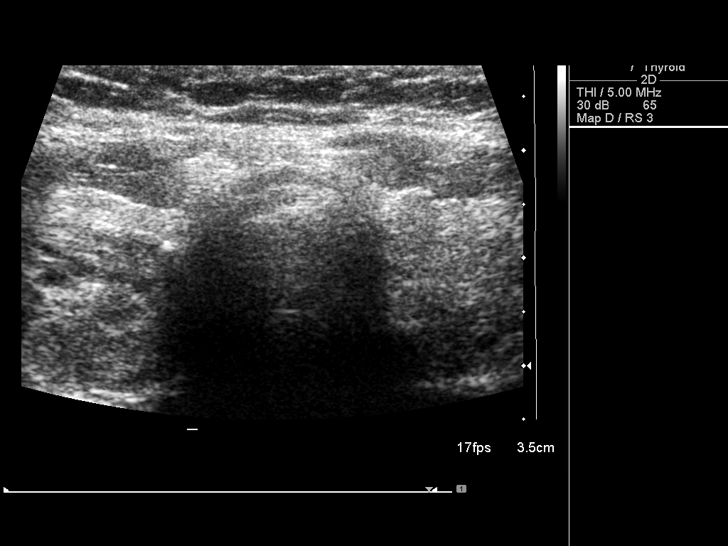
[im 4/42]
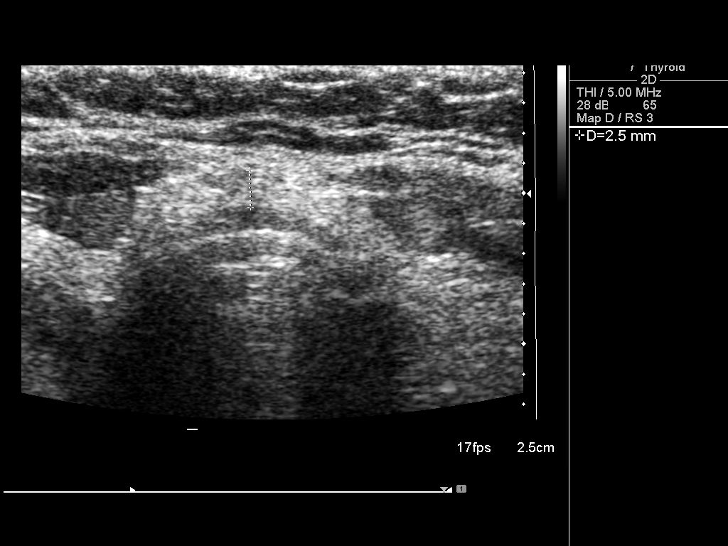
[im 7/42]
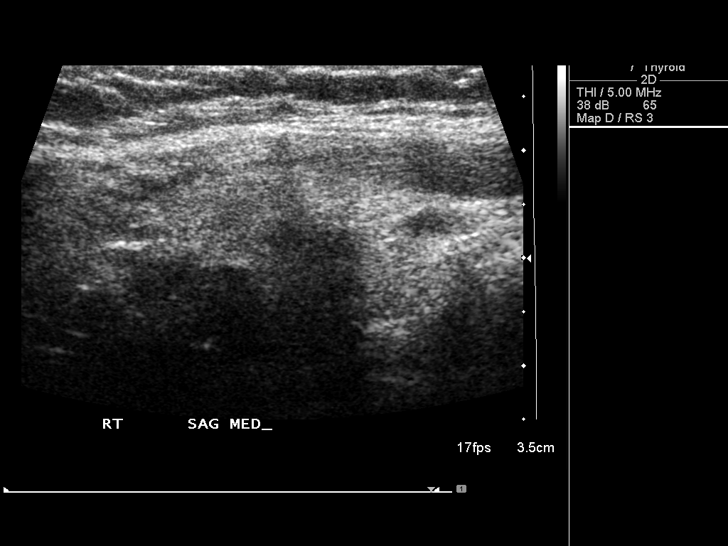
[im 11/42]
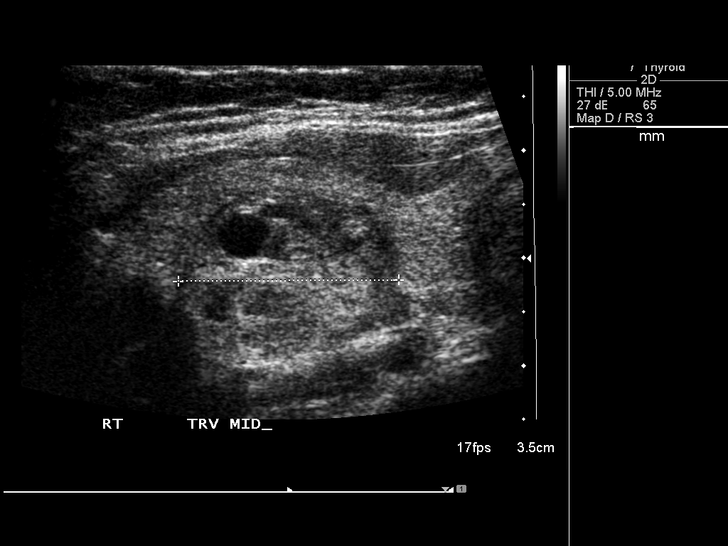
[im 14/42]
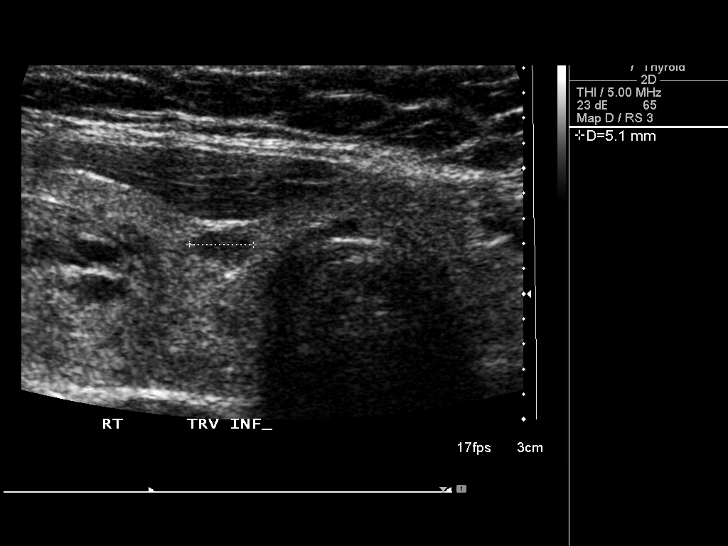
[im 18/42]
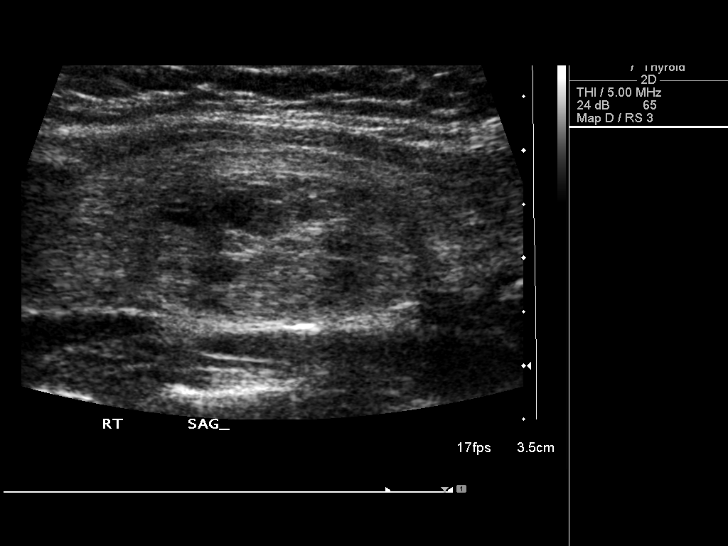
[im 21/42]
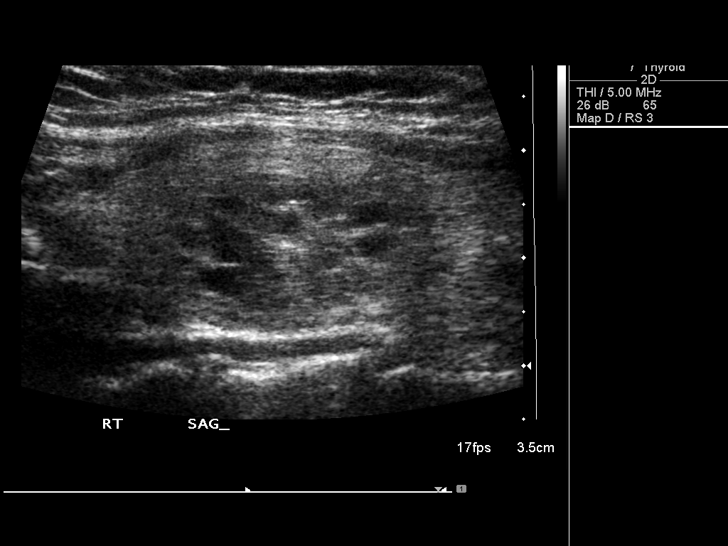
[im 24/42]
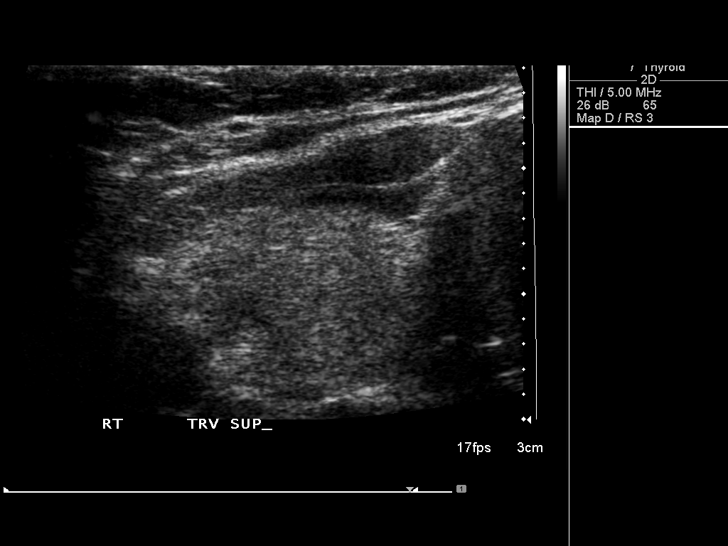
[im 28/42]
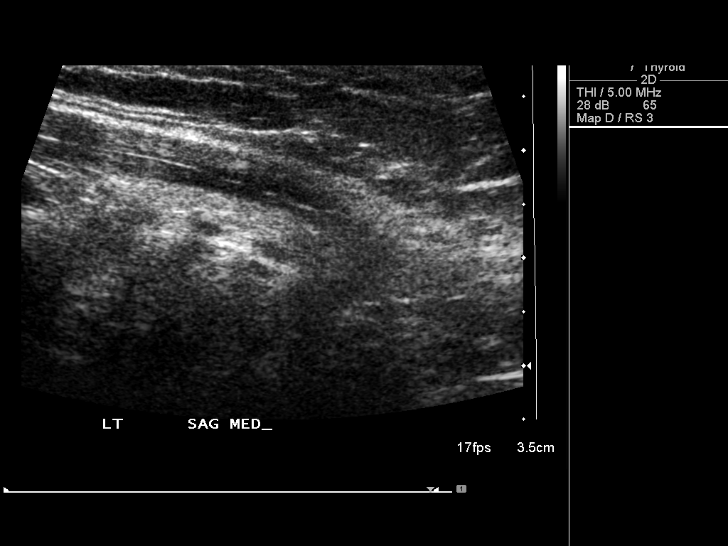
[im 31/42]
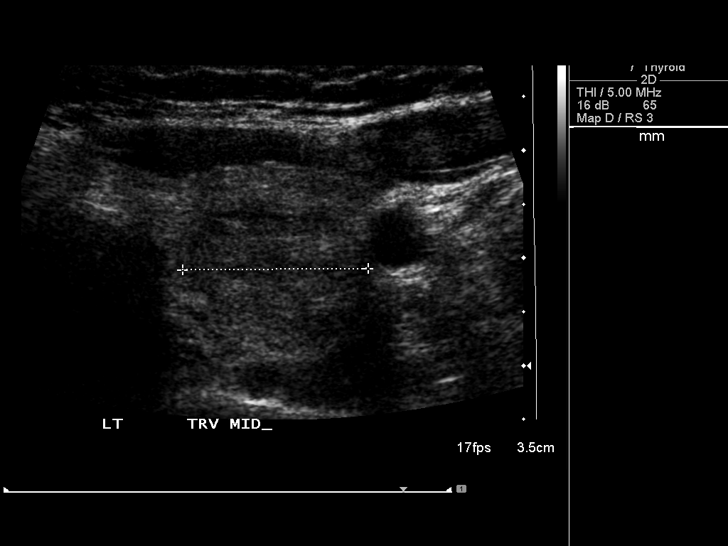
[im 35/42]
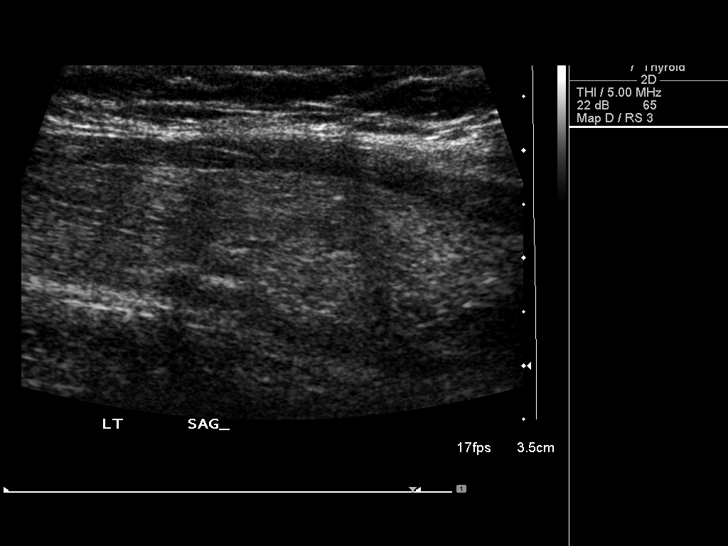
[im 38/42]
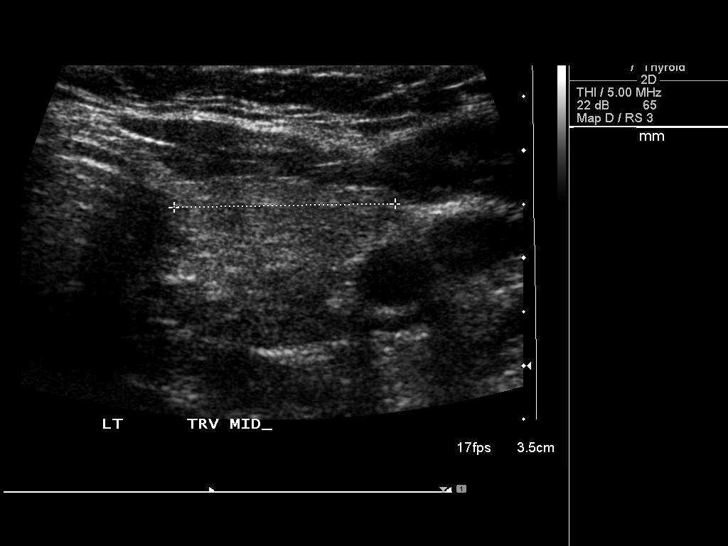
[im 42/42]
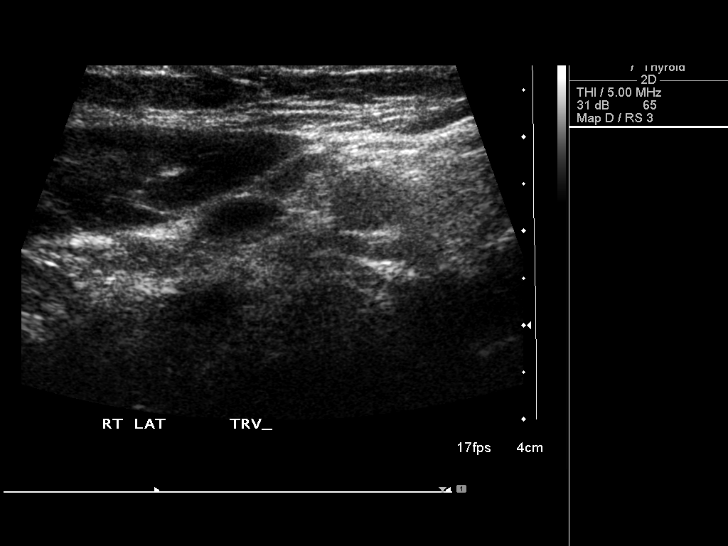

[13 of 25 positions shown; findings below may reference images not displayed]

FINDINGS: Parenchymal Echotexture: Mildly heterogenous

Estimated total number of nodules >/= 1 cm: 3

Number of spongiform nodules >/=  2 cm not described below (TR1): 0

Number of mixed cystic and solid nodules >/= 1.5 cm not described
below (TR2): 0

_________________________________________________________

Isthmus: 3 mm

No discrete nodules are identified within the thyroid isthmus.

_________________________________________________________

Right lobe: 5.2 x 1.7 x 2.5 cm, previously 6.0 x 1.8 x 2.1 cm

Nodule # 1:

Location: Right; Mid

Size: 2.7 x 1.5 x 2.1 cm, previously 2.7 x 1.4 x 1.6 cm

Composition: mixed cystic and solid (1)

Echogenicity: isoechoic (1)

Shape: not taller-than-wide (0)

Margins: ill-defined (0)

Echogenic foci: none (0)

ACR TI-RADS total points: 2.

ACR TI-RADS risk category: TR2 (2 points).

ACR TI-RADS recommendations:

This nodule does NOT meet TI-RADS criteria for biopsy or dedicated
follow-up. This nodule is stable compared 07/26/2015.

Nodule # 2:

Location: Right; Inferior

Size: 12 x 11 x 11 mm, previously 11 x 7 x 9 mm

Composition: solid/almost completely solid (2)

Echogenicity: isoechoic (1)

Shape: not taller-than-wide (0)

Margins: ill-defined (0)

Echogenic foci: none (0)

ACR TI-RADS total points: 3.

ACR TI-RADS risk category: TR3 (3 points).

ACR TI-RADS recommendations:

Given size (<1.4 cm) and appearance, this nodule does NOT meet
TI-RADS criteria for biopsy or dedicated follow-up. This nodule is
also stable compared to 07/26/2015.

There is a third additional tiny 7 mm medial right midpole nodule
also noted.

_________________________________________________________

Left lobe: 5.7 x 2.2 x 2.1 cm, previously 5.5 x 1.7 x 2.1 cm

Nodule # 3:

Location: Left; Mid

Size: 2.4 x 1.3 x 1.7 cm, previously 2.3 x 1.2 x 1.7 cm

Composition: mixed cystic and solid (1)

Echogenicity: isoechoic (1)

Shape: not taller-than-wide (0)

Margins: ill-defined (0)

Echogenic foci: none (0)

ACR TI-RADS total points: 2.

ACR TI-RADS risk category: TR2 (2 points).

ACR TI-RADS recommendations:

This nodule does NOT meet TI-RADS criteria for biopsy or dedicated
follow-up. This nodule is also stable compared 07/26/2015.
IMPRESSION: Stable bilateral thyroid nodules as detailed above. These nodules do
not meet criteria for biopsy or any additional follow-up.

The above is in keeping with the ACR TI-RADS recommendations - [HOSPITAL] 7944;[DATE].

## 2016-11-21 ENCOUNTER — Encounter: Payer: Self-pay | Admitting: Nurse Practitioner

## 2016-11-21 ENCOUNTER — Ambulatory Visit (INDEPENDENT_AMBULATORY_CARE_PROVIDER_SITE_OTHER): Payer: BC Managed Care – PPO | Admitting: Nurse Practitioner

## 2016-11-21 VITALS — BP 128/85 | HR 86 | Temp 97.6°F | Ht 62.0 in | Wt 201.0 lb

## 2016-11-21 DIAGNOSIS — Z01419 Encounter for gynecological examination (general) (routine) without abnormal findings: Secondary | ICD-10-CM

## 2016-11-21 DIAGNOSIS — E042 Nontoxic multinodular goiter: Secondary | ICD-10-CM

## 2016-11-21 DIAGNOSIS — Z0000 Encounter for general adult medical examination without abnormal findings: Secondary | ICD-10-CM | POA: Diagnosis not present

## 2016-11-21 NOTE — Patient Instructions (Signed)

## 2016-11-21 NOTE — Progress Notes (Signed)
   Subjective:    Patient ID: Leslie Morton, female    DOB: December 08, 1980, 36 y.o.   MRN: [id number]  HPI  Leslie Morton is here today for  Annual physical , PAP and follow up of chronic medical problem.  Outpatient Encounter Prescriptions as of 11/21/2016  Medication Sig  . LORYNA 3-0.02 MG tablet TAKE 1 TABLET BY MOUTH DAILY.  . Multiple Vitamin (MULTI VITAMIN PO) Take 1 tablet by mouth daily.   No facility-administered encounter medications on file as of 11/21/2016.     1. Multinodular goiter     New complaints: none  Social history:  [place] and to get a jib. Hs has taken the kids with him. Her mother rode out there with him.   Review of Systems  Constitutional: Negative for activity change and appetite change.  HENT: Negative.   Eyes: Negative for pain.  Respiratory: Negative for shortness of breath.   Cardiovascular: Negative for chest pain, palpitations and leg swelling.  Gastrointestinal: Negative for abdominal pain.  Endocrine: Negative for polydipsia.  Genitourinary: Negative.   Skin: Negative for rash.  Neurological: Negative for dizziness, weakness and headaches.  Hematological: Does not bruise/bleed easily.  Psychiatric/Behavioral: Negative.   All other systems reviewed and are negative.      Objective:   Physical Exam  Constitutional: She is oriented to person, place, and time. She appears well-developed and well-nourished.  HENT:  Head: Normocephalic.  Right Ear: Hearing, tympanic membrane, external ear and ear canal normal.  Left Ear: Hearing, tympanic membrane, external ear and ear canal normal.  Nose: Nose normal.  Mouth/Throat: Uvula is midline and oropharynx is clear and moist.  Eyes: Pupils are equal, round, and reactive to light. Conjunctivae and EOM are normal.  Neck: Normal range of motion and full passive range of motion without pain. Neck supple. No JVD present. Carotid bruit is not present. No thyroid mass  and no thyromegaly present.  Cardiovascular: Normal rate, normal heart sounds and intact distal pulses.   No murmur heard. Pulmonary/Chest: Effort normal and breath sounds normal. Right breast exhibits no inverted nipple, no mass, no nipple discharge, no skin change and no tenderness. Left breast exhibits no inverted nipple, no mass, no nipple discharge, no skin change and no tenderness.  Abdominal: Soft. Bowel sounds are normal. She exhibits no mass. There is no tenderness.  Genitourinary: Vagina normal and uterus normal. No breast swelling, tenderness, discharge or bleeding. No vaginal discharge found.  Genitourinary Comments: bimanual exam-No adnexal masses or tenderness. Cervix is parous and pink Large rectocele  Musculoskeletal: Normal range of motion.  Lymphadenopathy:    She has no cervical adenopathy.  Neurological: She is alert and oriented to person, place, and time.  Skin: Skin is warm and dry.  Psychiatric: She has a normal mood and affect. Her behavior is normal. Judgment and thought content normal.   BP 128/85   Pulse 86   Temp 97.6 F (36.4 C) (Oral)   Ht 5\' 2"  (1.575 m)   Wt 201 lb (91.2 kg)   BMI 36.76 kg/m         Assessment & Plan:

## 2016-11-22 LAB — LIPID PANEL
Chol/HDL Ratio: 2.5 ratio (ref 0.0–4.4)
Cholesterol, Total: 147 mg/dL (ref 100–199)
HDL: 58 mg/dL (ref >39)
LDL Calculated: 66 mg/dL (ref 0–99)
Triglycerides: 115 mg/dL (ref 0–149)
VLDL CHOLESTEROL CAL: 23 mg/dL (ref 5–40)

## 2016-11-22 LAB — CMP14+EGFR
ALT: 24 IU/L (ref 0–32)
AST: 32 IU/L (ref 0–40)
Albumin/Globulin Ratio: 1.6 (ref 1.2–2.2)
Albumin: 4.2 g/dL (ref 3.5–5.5)
Alkaline Phosphatase: 41 IU/L (ref 39–117)
BUN/Creatinine Ratio: 25 — ABNORMAL HIGH (ref 9–23)
BUN: 20 mg/dL (ref 6–20)
Bilirubin Total: 0.3 mg/dL (ref 0.0–1.2)
CALCIUM: 9.4 mg/dL (ref 8.7–10.2)
CO2: 18 mmol/L — AB (ref 20–29)
CREATININE: 0.81 mg/dL (ref 0.57–1.00)
Chloride: 102 mmol/L (ref 96–106)
GFR calc Af Amer: 110 mL/min/{1.73_m2} (ref >59)
GFR, EST NON AFRICAN AMERICAN: 96 mL/min/{1.73_m2} (ref >59)
GLUCOSE: 95 mg/dL (ref 65–99)
Globulin, Total: 2.7 g/dL (ref 1.5–4.5)
POTASSIUM: 3.9 mmol/L (ref 3.5–5.2)
Sodium: 135 mmol/L (ref 134–144)
Total Protein: 6.9 g/dL (ref 6.0–8.5)

## 2016-11-22 LAB — CBC WITH DIFFERENTIAL/PLATELET
BASOS: 1 %
Basophils Absolute: 0 10*3/uL (ref 0.0–0.2)
EOS (ABSOLUTE): 0.2 10*3/uL (ref 0.0–0.4)
EOS: 3 %
Hematocrit: 40.6 % (ref 34.0–46.6)
Hemoglobin: 13.5 g/dL (ref 11.1–15.9)
IMMATURE GRANS (ABS): 0 10*3/uL (ref 0.0–0.1)
IMMATURE GRANULOCYTES: 0 %
LYMPHS: 38 %
Lymphocytes Absolute: 3 10*3/uL (ref 0.7–3.1)
MCH: 28.4 pg (ref 26.6–33.0)
MCHC: 33.3 g/dL (ref 31.5–35.7)
MCV: 86 fL (ref 79–97)
Monocytes Absolute: 0.6 10*3/uL (ref 0.1–0.9)
Monocytes: 8 %
NEUTROS PCT: 50 %
Neutrophils Absolute: 4 10*3/uL (ref 1.4–7.0)
PLATELETS: 300 10*3/uL (ref 150–379)
RBC: 4.75 x10E6/uL (ref 3.77–5.28)
RDW: 13 % (ref 12.3–15.4)
WBC: 7.9 10*3/uL (ref 3.4–10.8)

## 2016-11-22 LAB — THYROID PANEL WITH TSH
Free Thyroxine Index: 2.1 (ref 1.2–4.9)
T3 Uptake Ratio: 16 % — ABNORMAL LOW (ref 24–39)
T4, Total: 13.3 ug/dL — ABNORMAL HIGH (ref 4.5–12.0)
TSH: 2.37 u[IU]/mL (ref 0.450–4.500)

## 2016-11-23 LAB — IGP, APTIMA HPV, RFX 16/18,45
HPV Aptima: NEGATIVE
PAP Smear Comment: 0

## 2016-12-10 ENCOUNTER — Ambulatory Visit (INDEPENDENT_AMBULATORY_CARE_PROVIDER_SITE_OTHER): Payer: BC Managed Care – PPO | Admitting: Family Medicine

## 2016-12-10 ENCOUNTER — Encounter: Payer: Self-pay | Admitting: Family Medicine

## 2016-12-10 VITALS — BP 133/88 | HR 88 | Temp 98.1°F | Ht 62.0 in | Wt 195.6 lb

## 2016-12-10 DIAGNOSIS — W57XXXA Bitten or stung by nonvenomous insect and other nonvenomous arthropods, initial encounter: Secondary | ICD-10-CM

## 2016-12-10 DIAGNOSIS — S90861A Insect bite (nonvenomous), right foot, initial encounter: Secondary | ICD-10-CM

## 2016-12-10 MED ORDER — PREDNISONE 10 MG (21) PO TBPK: ORAL_TABLET | Freq: Every day | ORAL | 0 refills | Status: DC

## 2016-12-10 NOTE — Patient Instructions (Signed)
Great to see you!  I believe thi sis just a local histamine reaction. I do not believe that it represents a true full bee allergy.   Please call with any concerns  Try a daily claritin for 1 week as well as the prednisone.

## 2016-12-10 NOTE — Progress Notes (Signed)
   HPI  Patient presents today here with a bee sting.  Patient states that she was mowing (Sunday when she was stung by a bee on the right foot. She continued mowing and finished her job. She had some small amount of redness and swelling yesterday that was worsening today.  She denies fever, chills, sweats, or prior allergy to bees or other stimulus sex.  She has tolerated prednisone well previously Oral Benadryl causes her to have hypersomnolence for 24 hours that she's avoided this. She has used Benadryl cream.  PMH: Smoking status noted ROS: Per HPI  Objective: BP 133/88   Pulse 88   Temp 98.1 F (36.7 C) (Oral)   Ht 5\' 2"  (1.575 m)   Wt 195 lb 9.6 oz (88.7 kg)   BMI 35.78 kg/m  Gen: NAD, alert, cooperative with exam HEENT: NCAT CV: RRR, good S1/S2, no murmur Resp: CTABL, no wheezes, non-labored Ext: No edema, warm Neuro: Alert and oriented, No gross deficits Skin:  Redness and tenderness of the R foot. Small lesion flexor crease at the superior edge of the R foot.   Assessment and plan:  # Insect bite Swelling and tenderness of the foot after a bee sting about 2 days ago. Short course of prednisone, recommended daily antihistamine Work note written I did consider localized cellulitis, I discussed with her at length signs and symptoms of cellulitis, if she were to call back with these I will recommend a short course of Keflex 500 mg 3 times daily 7 days.   Meds ordered this encounter  Medications  . predniSONE (STERAPRED UNI-PAK 21 TAB) 10 MG (21) TBPK tablet    Sig: Take by mouth daily. As directed x 6 days    Dispense:  21 tablet    Refill:  0    Murtis SinkSam Bradshaw, MD [facility] 12/10/2016, 2:33 PM

## 2017-02-06 ENCOUNTER — Encounter: Payer: Self-pay | Admitting: Endocrinology

## 2017-02-06 ENCOUNTER — Ambulatory Visit (INDEPENDENT_AMBULATORY_CARE_PROVIDER_SITE_OTHER): Payer: BC Managed Care – PPO | Admitting: Endocrinology

## 2017-02-06 VITALS — BP 116/82 | HR 86 | Wt 190.3 lb

## 2017-02-06 DIAGNOSIS — E042 Nontoxic multinodular goiter: Secondary | ICD-10-CM | POA: Diagnosis not present

## 2017-02-06 NOTE — Progress Notes (Signed)
   Subjective:    Patient ID: Leslie Morton, female    DOB: 03/16/1981, 36 y.o.   MRN: [id number]  HPI Pt returns for f/u of multinodular goiter (dx'ed 2017; bx of largest right nodule showed BENIGN FOLLICULAR NODULE (BETHESDA CATEGORY II); she has been euthyroid).  She does not notice the goiter.   Past Medical History:  Diagnosis Date  . Multinodular goiter   . Vaginal Pap smear, abnormal     Past Surgical History:  Procedure Laterality Date  . WISDOM TOOTH EXTRACTION      Social History   Social History  . Marital status: Married    Spouse name: N/A  . Number of children: N/A  . Years of education: N/A   Occupational History  . Not on file.   Social History Main Topics  . Smoking status: Never Smoker  . Smokeless tobacco: Never Used  . Alcohol use No  . Drug use: No  . Sexual activity: Not on file   Other Topics Concern  . Not on file   Social History Narrative  . No narrative on file    Current Outpatient Prescriptions on File Prior to Visit  Medication Sig Dispense Refill  . Cholecalciferol (VITAMIN D3) 2000 units TABS Take 1 tablet by mouth daily.    . LORYNA 3-0.02 MG tablet TAKE 1 TABLET BY MOUTH DAILY. 28 tablet 5  . Multiple Vitamin (MULTI VITAMIN PO) Take 1 tablet by mouth daily.    . predniSONE (STERAPRED UNI-PAK 21 TAB) 10 MG (21) TBPK tablet Take by mouth daily. As directed x 6 days (Patient not taking: Reported on 02/06/2017) 21 tablet 0   No current facility-administered medications on file prior to visit.     No Known Allergies  Family History  Problem Relation Age of Onset  . Thyroid disease Neg Hx     BP 116/82   Pulse 86   Wt 190 lb 4.8 oz (86.3 kg)   SpO2 98%   BMI 34.81 kg/m    Review of Systems Denies neck pain and sob.     Objective:   Physical Exam VITAL SIGNS:  See vs page.   GENERAL: no distress.  NECK: probable 2 cm right upper lobe thyroid nodule is noted.   Lab Results  Component Value Date   TSH 2.370 11/21/2016   T4TOTAL 13.3 (H) 11/21/2016      Assessment & Plan:  Multinodular goiter, euthyroid: due for recheck  Patient Instructions  Let's recheck the ultrasound.  you will receive a phone call, about a day and time for an appointment. If there is no significant change, Please come back for a follow-up appointment in 2 years.

## 2017-02-06 NOTE — Patient Instructions (Signed)
Let's recheck the ultrasound.  you will receive a phone call, about a day and time for an appointment. If there is no significant change, Please come back for a follow-up appointment in 2 years.

## 2017-02-07 ENCOUNTER — Ambulatory Visit: Payer: BC Managed Care – PPO | Admitting: Endocrinology

## 2017-02-10 ENCOUNTER — Telehealth: Payer: Self-pay | Admitting: Endocrinology

## 2017-02-10 DIAGNOSIS — E042 Nontoxic multinodular goiter: Secondary | ICD-10-CM

## 2017-02-10 NOTE — Telephone Encounter (Signed)
-----   Message from Sarah E Terrell, CMA sent at 02/10/2017  4:35 PM EDT ----- Regarding: Thyroid Biopsy Latisha from [facility] called & stated that in radiologist notes(Dr. Shick) from last year, that patient( Leslie Morton) didn't meet the criteria for biopsy. She gave me radiologist direct number [phone]. Latisha wasn't sure if the tow of you needed to discuss this further. Her direct number is [phone]. 

## 2017-02-10 NOTE — Telephone Encounter (Signed)
Should be just [place].  I have corrected and reordered.

## 2017-02-10 NOTE — Telephone Encounter (Signed)
-----   Message from Davis Gourd, CMA sent at 02/10/2017  4:35 PM EDT ----- Regarding: Thyroid Biopsy Latisha from [facility] called & stated that in radiologist notes(Dr. Miles Costain) from last year, that patientAsa Lente Demarais) didn't meet the criteria for biopsy. She gave me radiologist direct number [phone]. Debbe Odea wasn't sure if the tow of you needed to discuss this further. Her direct number is [phone].

## 2017-02-11 NOTE — Telephone Encounter (Signed)
Called [place] & left her a VM that it was just [place] needed not biopsy. I also stated that [place] had been ordered.

## 2017-02-14 ENCOUNTER — Ambulatory Visit
Admission: RE | Admit: 2017-02-14 | Discharge: 2017-02-14 | Disposition: A | Discharge status: Home / Self Care | Payer: BC Managed Care – PPO | Source: Ambulatory Visit | Attending: Endocrinology | Admitting: Endocrinology

## 2017-02-14 DIAGNOSIS — E042 Nontoxic multinodular goiter: Secondary | ICD-10-CM

## 2017-02-24 ENCOUNTER — Other Ambulatory Visit: Payer: Self-pay | Admitting: *Deleted

## 2017-02-24 MED ORDER — DROSPIRENONE-ETHINYL ESTRADIOL 3-0.02 MG PO TABS: 1.0000 | ORAL_TABLET | Freq: Every day | ORAL | 2 refills | Status: DC

## 2017-06-12 ENCOUNTER — Telehealth: Payer: Self-pay | Admitting: Family

## 2017-06-12 NOTE — Telephone Encounter (Signed)
Her throat is a bit sore.  Can antibiotic be sent in since her son was seen Monday by [facility] and had a positive strep test. Advised may need to schedule an appointment.

## 2017-06-13 ENCOUNTER — Ambulatory Visit: Payer: BC Managed Care – PPO | Admitting: Pediatrics

## 2017-06-13 MED ORDER — AMOXICILLIN 875 MG PO TABS: 875.0000 mg | ORAL_TABLET | Freq: Two times a day (BID) | ORAL | 0 refills | Status: DC

## 2017-06-13 NOTE — Telephone Encounter (Signed)
Patient currently here at office to be seen

## 2017-06-13 NOTE — Telephone Encounter (Signed)
I sent some amoxicillin in for you

## 2017-11-08 ENCOUNTER — Other Ambulatory Visit: Payer: Self-pay | Admitting: Nurse Practitioner

## 2017-11-24 ENCOUNTER — Encounter: Payer: Self-pay | Admitting: Nurse Practitioner

## 2017-11-24 ENCOUNTER — Ambulatory Visit (INDEPENDENT_AMBULATORY_CARE_PROVIDER_SITE_OTHER): Payer: BC Managed Care – PPO | Admitting: Nurse Practitioner

## 2017-11-24 VITALS — BP 134/88 | HR 77 | Temp 98.3°F | Ht 62.0 in | Wt 189.0 lb

## 2017-11-24 DIAGNOSIS — Z0000 Encounter for general adult medical examination without abnormal findings: Secondary | ICD-10-CM

## 2017-11-24 NOTE — Progress Notes (Signed)
Subjective:    Patient ID: Leslie Morton, female    DOB: 12/11/1980, 36 y.o.   MRN: [id number]   Chief Complaint: Annual Exam (No pap.. HAs before periods)   HPI:  1. Annual physical exam   2. Gynecologic exam normal     Outpatient Encounter Medications as of 11/24/2017  Medication Sig  . Cholecalciferol (VITAMIN D3) 2000 units TABS Take 1 tablet by mouth daily.  . drospirenone-ethinyl estradiol (LORYNA) 3-0.02 MG tablet Take 1 tablet by mouth daily.  . Multiple Vitamin (MULTI VITAMIN PO) Take 1 tablet by mouth daily.       New complaints: None today  Social history: Works in the [employer] field.   Review of Systems  Constitutional: Negative for activity change and appetite change.  HENT: Negative.   Eyes: Negative for pain.  Respiratory: Negative for shortness of breath.   Cardiovascular: Negative for chest pain, palpitations and leg swelling.  Gastrointestinal: Negative for abdominal pain.  Endocrine: Negative for polydipsia.  Genitourinary: Negative.   Skin: Negative for rash.  Neurological: Negative for dizziness, weakness and headaches.  Hematological: Does not bruise/bleed easily.  Psychiatric/Behavioral: Negative.   All other systems reviewed and are negative.      Objective:   Physical Exam  Constitutional: She is oriented to person, place, and time. She appears well-developed and well-nourished.  HENT:  Head: Normocephalic.  Nose: Nose normal.  Mouth/Throat: Oropharynx is clear and moist.  Eyes: Pupils are equal, round, and reactive to light. EOM are normal.  Neck: Normal range of motion. Neck supple. No JVD present. Carotid bruit is not present.  Cardiovascular: Normal rate, regular rhythm, normal heart sounds and intact distal pulses.  Pulmonary/Chest: Effort normal and breath sounds normal. No respiratory distress. She has no wheezes. She has no rales. She exhibits no tenderness.  Abdominal: Soft. Normal appearance, normal aorta and bowel sounds are  normal. She exhibits no distension, no abdominal bruit, no pulsatile midline mass and no mass. There is no splenomegaly or hepatomegaly. There is no tenderness.  Musculoskeletal: Normal range of motion. She exhibits no edema.  Lymphadenopathy:    She has no cervical adenopathy.  Neurological: She is alert and oriented to person, place, and time. She has normal reflexes.  Skin: Skin is warm and dry.  Psychiatric: She has a normal mood and affect. Her behavior is normal. Judgment and thought content normal.    BP 134/88   Pulse 77   Temp 98.3 F (36.8 C) (Oral)   Ht 5' 2" (1.575 m)   Wt 189 lb (85.7 kg)   BMI 34.57 kg/m        Assessment & Plan:  Leslie Morton comes in today with chief complaint of Annual Exam (No pap.. HAs before periods)   Diagnosis and orders addressed:  1. Annual physical exam - CMP14+EGFR - Lipid panel - Thyroid Panel With TSH - CBC with Differential/Platelet   Labs pending Health Maintenance reviewed Diet and exercise encouraged  Follow up plan:  1 year Will order mirena for patient to be inserted in the next 2 weeks   Mary-Margaret Martin, FNP  

## 2017-11-24 NOTE — Patient Instructions (Signed)

## 2017-11-25 LAB — CMP14+EGFR
ALT: 8 IU/L (ref 0–32)
AST: 14 IU/L (ref 0–40)
Albumin/Globulin Ratio: 1.8 (ref 1.2–2.2)
Albumin: 4.6 g/dL (ref 3.5–5.5)
Alkaline Phosphatase: 57 IU/L (ref 39–117)
BUN/Creatinine Ratio: 14 (ref 9–23)
BUN: 11 mg/dL (ref 6–20)
Bilirubin Total: 0.3 mg/dL (ref 0.0–1.2)
CALCIUM: 9.3 mg/dL (ref 8.7–10.2)
CO2: 22 mmol/L (ref 20–29)
Chloride: 105 mmol/L (ref 96–106)
Creatinine, Ser: 0.78 mg/dL (ref 0.57–1.00)
GFR calc non Af Amer: 99 mL/min/{1.73_m2} (ref >59)
GFR, EST AFRICAN AMERICAN: 115 mL/min/{1.73_m2} (ref >59)
Globulin, Total: 2.6 g/dL (ref 1.5–4.5)
Glucose: 89 mg/dL (ref 65–99)
Potassium: 3.9 mmol/L (ref 3.5–5.2)
Sodium: 141 mmol/L (ref 134–144)
TOTAL PROTEIN: 7.2 g/dL (ref 6.0–8.5)

## 2017-11-25 LAB — CBC WITH DIFFERENTIAL/PLATELET
BASOS: 0 %
Basophils Absolute: 0 10*3/uL (ref 0.0–0.2)
EOS (ABSOLUTE): 0.2 10*3/uL (ref 0.0–0.4)
EOS: 3 %
HEMOGLOBIN: 14.2 g/dL (ref 11.1–15.9)
Hematocrit: 44.3 % (ref 34.0–46.6)
Immature Grans (Abs): 0 10*3/uL (ref 0.0–0.1)
Immature Granulocytes: 0 %
LYMPHS ABS: 3.1 10*3/uL (ref 0.7–3.1)
Lymphs: 46 %
MCH: 28.2 pg (ref 26.6–33.0)
MCHC: 32.1 g/dL (ref 31.5–35.7)
MCV: 88 fL (ref 79–97)
MONOCYTES: 7 %
Monocytes Absolute: 0.5 10*3/uL (ref 0.1–0.9)
Neutrophils Absolute: 3.1 10*3/uL (ref 1.4–7.0)
Neutrophils: 44 %
PLATELETS: 315 10*3/uL (ref 150–450)
RBC: 5.03 x10E6/uL (ref 3.77–5.28)
RDW: 13 % (ref 12.3–15.4)
WBC: 6.9 10*3/uL (ref 3.4–10.8)

## 2017-11-25 LAB — LIPID PANEL
Chol/HDL Ratio: 2.2 ratio (ref 0.0–4.4)
Cholesterol, Total: 162 mg/dL (ref 100–199)
HDL: 74 mg/dL (ref >39)
LDL CALC: 66 mg/dL (ref 0–99)
Triglycerides: 110 mg/dL (ref 0–149)
VLDL Cholesterol Cal: 22 mg/dL (ref 5–40)

## 2017-11-25 LAB — THYROID PANEL WITH TSH
Free Thyroxine Index: 2.3 (ref 1.2–4.9)
T3 Uptake Ratio: 18 % — ABNORMAL LOW (ref 24–39)
T4, Total: 12.7 ug/dL — ABNORMAL HIGH (ref 4.5–12.0)
TSH: 1.22 u[IU]/mL (ref 0.450–4.500)

## 2017-12-01 ENCOUNTER — Encounter: Payer: Self-pay | Admitting: Nurse Practitioner

## 2017-12-01 ENCOUNTER — Ambulatory Visit: Payer: BC Managed Care – PPO | Admitting: Nurse Practitioner

## 2017-12-01 VITALS — BP 127/84 | HR 88 | Temp 98.1°F | Ht 62.0 in | Wt 190.0 lb

## 2017-12-01 DIAGNOSIS — Z30430 Encounter for insertion of intrauterine contraceptive device: Secondary | ICD-10-CM

## 2017-12-01 LAB — PREGNANCY, URINE: PREG TEST UR: NEGATIVE

## 2017-12-01 MED ORDER — LEVONORGESTREL 20 MCG/24HR IU IUD
INTRAUTERINE_SYSTEM | Freq: Once | INTRAUTERINE | Status: AC
  Administered 2017-12-01: 11:00:00 via INTRAUTERINE

## 2017-12-01 NOTE — Patient Instructions (Signed)

## 2017-12-01 NOTE — Progress Notes (Signed)
   Subjective:    Patient ID: Leslie Morton, female    DOB: 08-27-1980, 37 y.o.   MRN: [id number]   Chief Complaint: Contraception   HPI Patient come sin today for IUD insertion. She is G2P2A0. Her LMP wa s7/24/19. She desires iud insertion rather hat taking birth control. Sh ewas iven reading material about the mirena when she had CPE on 11/24/17. sheis dong well today without cmplaints.    Review of Systems  Constitutional: Negative for activity change and appetite change.  HENT: Negative.   Eyes: Negative for pain.  Respiratory: Negative for shortness of breath.   Cardiovascular: Negative for chest pain, palpitations and leg swelling.  Gastrointestinal: Negative for abdominal pain.  Endocrine: Negative for polydipsia.  Genitourinary: Negative.   Skin: Negative for rash.  Neurological: Negative for dizziness, weakness and headaches.  Hematological: Does not bruise/bleed easily.  Psychiatric/Behavioral: Negative.   All other systems reviewed and are negative.      Objective:   Physical Exam  Constitutional: She appears well-developed and well-nourished. No distress.  Cardiovascular: Normal rate and regular rhythm.  Pulmonary/Chest: Effort normal.  Genitourinary: Vagina normal and uterus normal.  Neurological: She is alert.  Skin: Skin is warm.  Psychiatric: She has a normal mood and affect. Her behavior is normal. Judgment and thought content normal.   Procedure:  Lithotomy position  larde speculum inserted  Betadine to cervical os  Single tooth tenaculum to ant lip of cervix  Sound measures 8mm  mirena inserted without complications  Tenaculum and speculum removed  Patient tolerated well   BP 127/84   Pulse 88   Temp 98.1 F (36.7 C) (Oral)   Ht 5\' 2"  (1.575 m)   Wt 190 lb (86.2 kg)   BMI 34.75 kg/m         Assessment & Plan:  Leslie Morton in today with chief complaint of Contraception   1. Encounter for IUD insertion Discussed that bleeding and cramping  may occur for the next 24 hours.motrin OTC as needed for cramping [place] 2 [place]- she was shown how to check for string. mirena should be replaced in 5 years - Pregnancy, urine  Mary-Margaret Daphine DeutscherMartin, FNP

## 2018-02-17 ENCOUNTER — Telehealth: Payer: Self-pay | Admitting: Nurse Practitioner

## 2018-02-24 NOTE — Telephone Encounter (Signed)
Patient had called concerning her dads FMLA paperwork. It has been taken care of per patient. Advised her to contact office if she needed any further assistance.

## 2018-11-01 IMAGING — US US THYROID
1 series · 13 of 25 positions shown · non-contrast
Comparison: Prior thyroid ultrasound 02/26/2016

CLINICAL DATA: Goiter. 36-year-old female with a history of thyroid
nodules

EXAM:
THYROID ULTRASOUND
TECHNIQUE: Ultrasound examination of the thyroid gland and adjacent soft
tissues was performed.

[Series 1: us thyroid · 0.08mm/px · 13 of 64 slices shown]
[im 1/64]
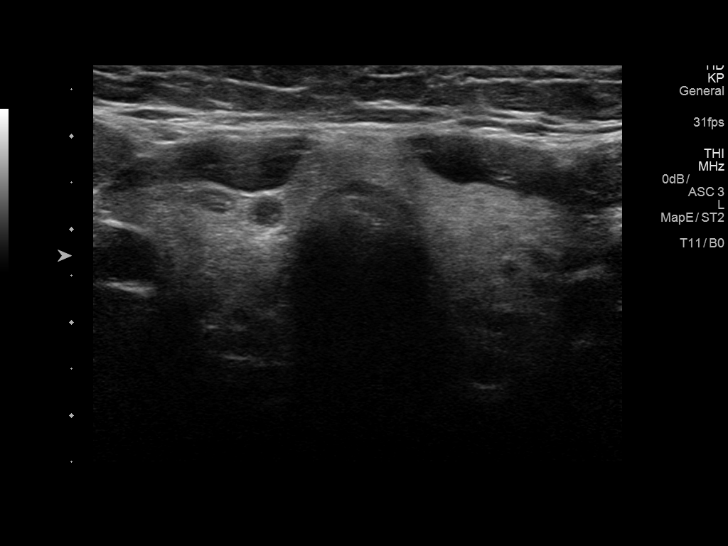
[im 6/64]
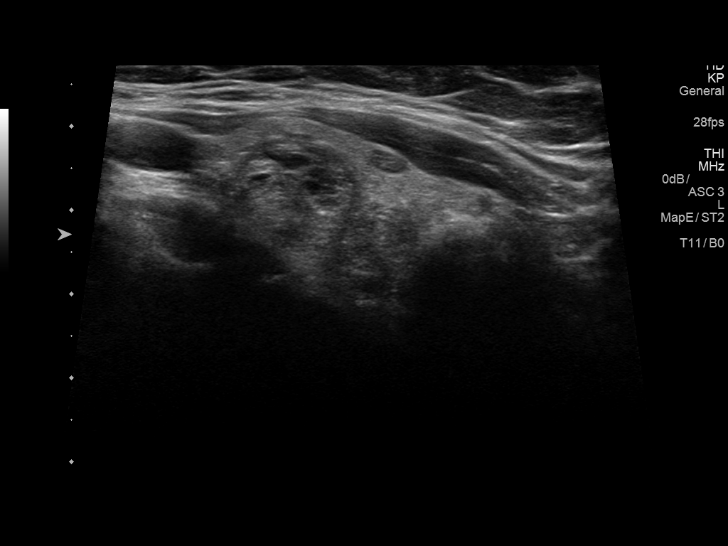
[im 11/64]
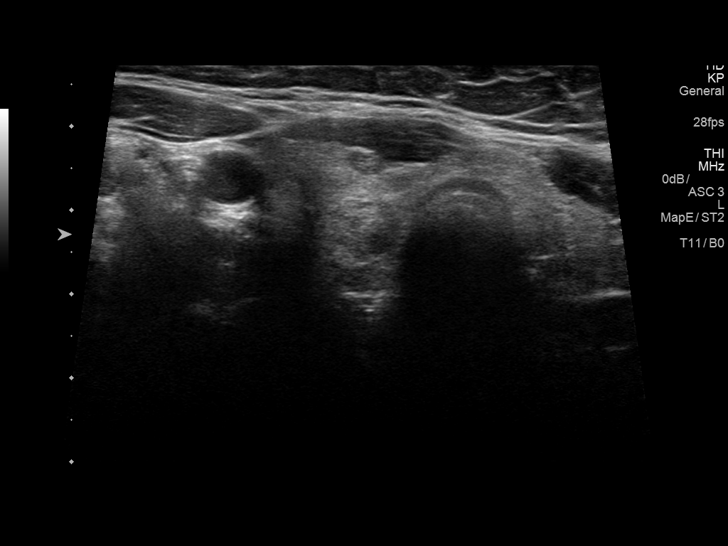
[im 16/64]
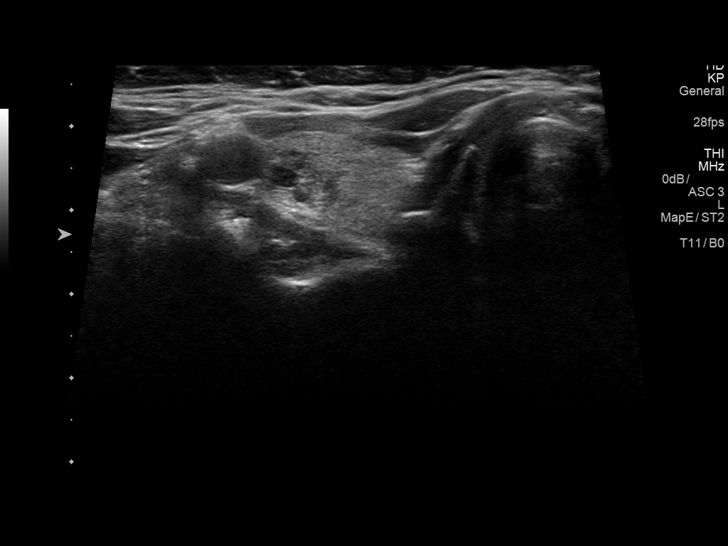
[im 22/64]
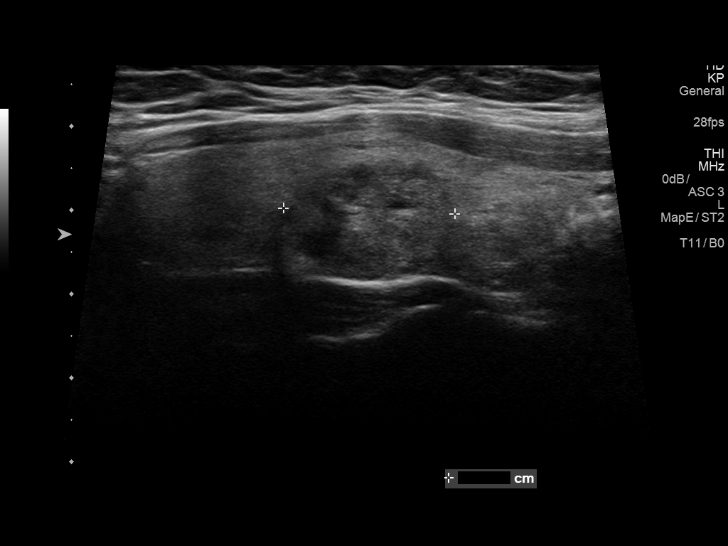
[im 27/64]
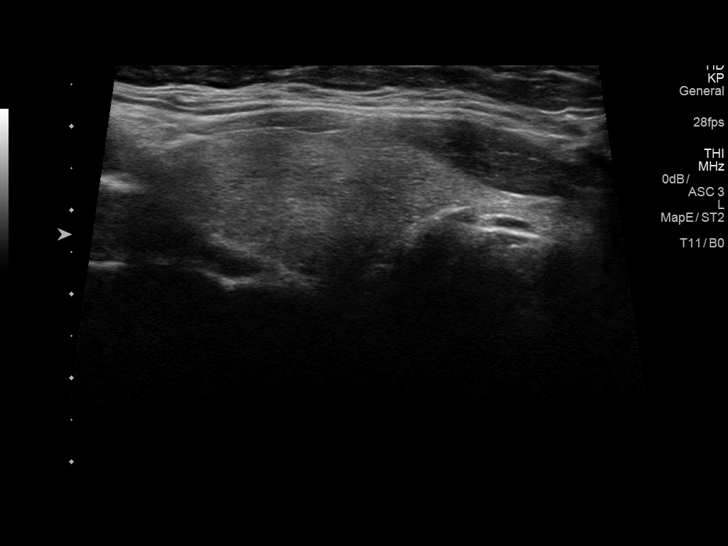
[im 32/64]
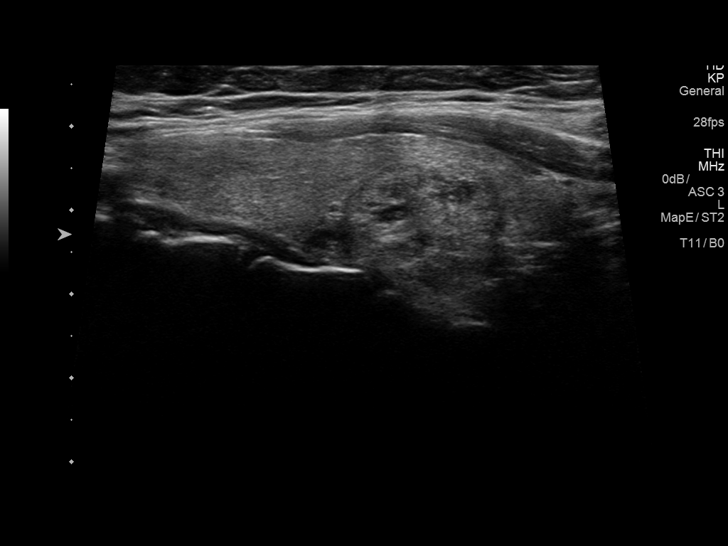
[im 37/64]
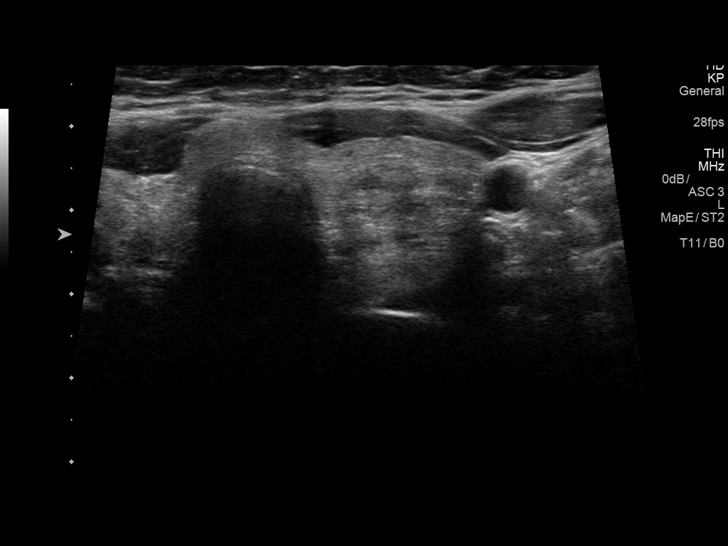
[im 43/64]
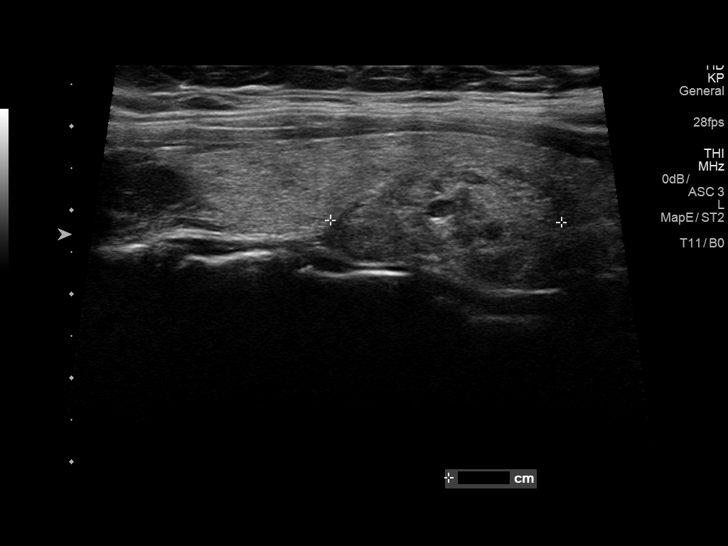
[im 48/64]
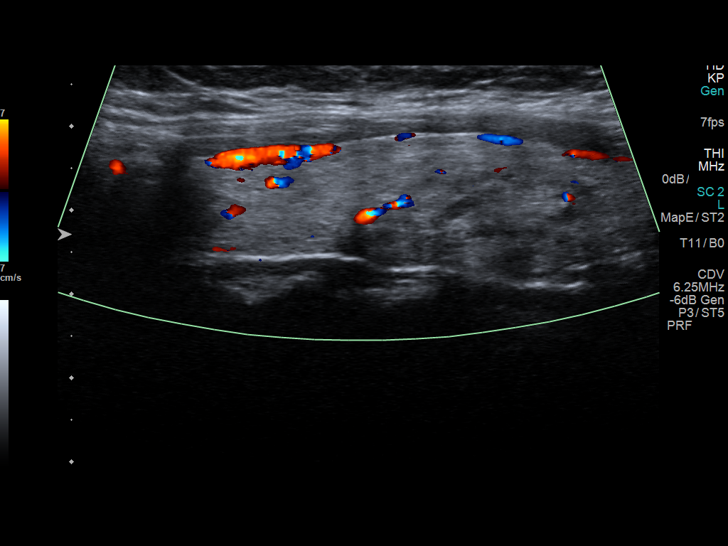
[im 53/64]
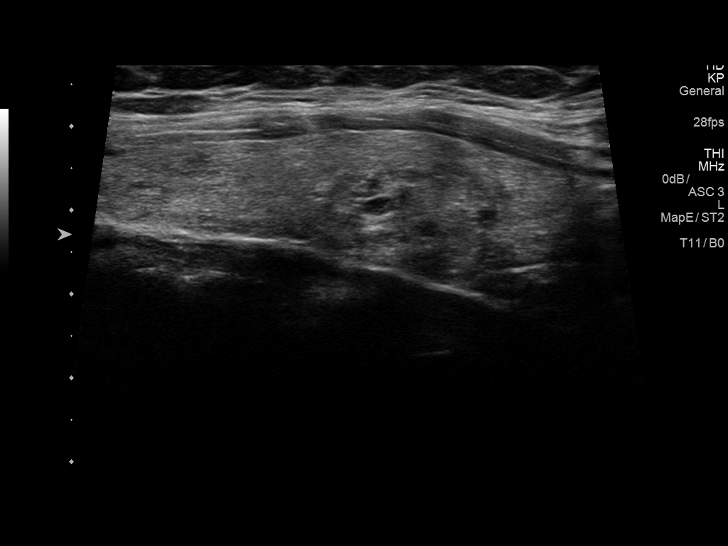
[im 58/64]
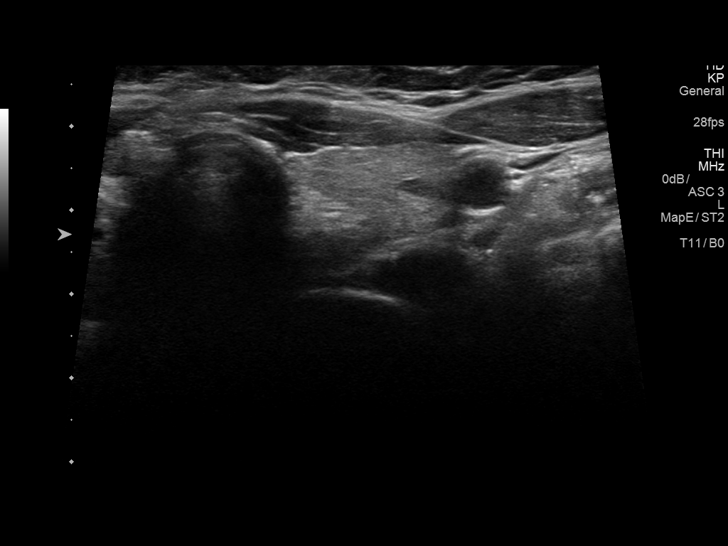
[im 64/64]
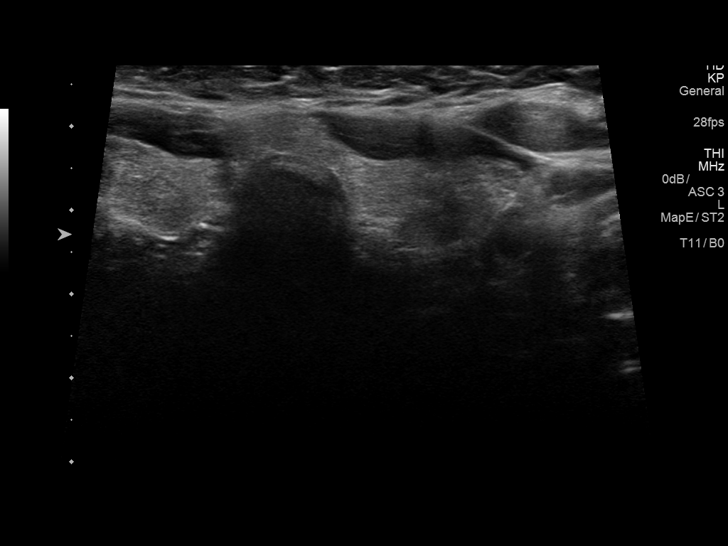

[13 of 25 positions shown; findings below may reference images not displayed]

FINDINGS: Parenchymal Echotexture: Mildly heterogenous

Isthmus: 0.6 cm

Right lobe: 6.1 x 1.7 x 2.2 cm

Left lobe: 6.3 x 2.0 x 2.1 cm

_________________________________________________________

Estimated total number of nodules >/= 1 cm:  4

Number of spongiform nodules >/=  2 cm not described below (TR1):

Number of mixed cystic and solid nodules >/= 1.5 cm not described
below (TR2):

_________________________________________________________

Slightly decreased size of the previously noted TI-RADS category 2
nodule in the right mid gland. This lesion measures 2 x 1.5 x 1.6 cm
today compared to 2.7 x 1.5 by 2.1 cm previously. Again, this nodule
does not meet criteria for further evaluation. The previously noted
TI-RADS category 3 nodule in the right inferior gland is also
insignificantly changed at 1.3 x 1.1 x 1.1 cm. This nodule does not
meet criteria for further evaluation. Small benign appearing
spongiform nodule in the upper gland measures 1.3 cm. This nodule
does not meet criteria for further evaluation.

Similar appearance of a TI-RADS category 2 nodule in the left mid
gland. No significant interval change. The lesion measures 2.6 x
x 1.6 cm. This lesion does not meet criteria for further evaluation.
IMPRESSION: Stable bilateral thyroid nodules without significant interval change
compared to 02/26/2016. The visualized nodules do not meet criteria
for further evaluation.

The above is in keeping with the ACR TI-RADS recommendations - [HOSPITAL] 0568;[DATE].
